# Patient Record
Sex: Female | Born: 1975
Health system: Southern US, Community
[De-identification: ages and names within clinical notes are randomized; demographics above are authoritative.]

---

## 2008-06-12 IMAGING — NM NM HEPATO W/GB/PHARM/[PERSON_NAME]
2 series · 12 of 12 positions shown · non-contrast
Comparison: None

CLINICAL DATA: Pain and vomiting

NUCLEAR MEDICINE HEPATOBILIARY IMAGING WITH GALLBLADDER EF
TECHNIQUE: Sequential images of the abdomen were obtained [DATE] minutes following intravenous administration of
radiopharmaceutical. After oral ingestion of 8oz of half and half
cream, gallbladder ejection fraction was determined.
Radiopharmaceutical:  5.2 mCi mCi [6F] Choletec

[hepato · 2.40mm/px · 6 of 60 frames shown (1 of 2)]
[frame 6/60]
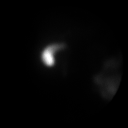
[frame 16/60]
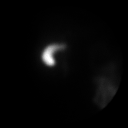
[frame 26/60]
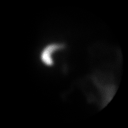
[frame 36/60]
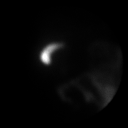
[frame 46/60]
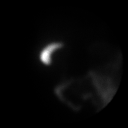
[frame 56/60]
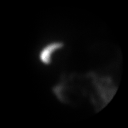

[hepato · 2.40mm/px · 6 of 60 frames shown (2 of 2)]
[frame 6/60]
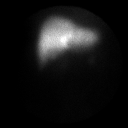
[frame 16/60]
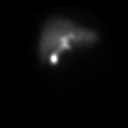
[frame 26/60]
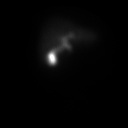
[frame 36/60]
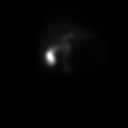
[frame 46/60]
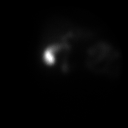
[frame 56/60]
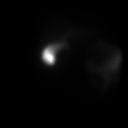

[12 of 12 positions shown; findings below may reference images not displayed]

FINDINGS: There is prompt extraction of radiotracer from the blood
pool homogeneous uptake in the liver.  The gallbladder begins to
fill by 15 minutes.  Counts are within the bowel by 40 minutes.  At
60 minutes, a fatty meal was provided orally.  The gallbladder
contracted slowly with a calculated ejection fraction equal 33% at
60 minutes (normal greater than 50%).
IMPRESSION: 1.  Mild biliary dyskinesia with ejection fraction =  33%.
2.  Patent cystic and common bile duct.

## 2008-06-23 IMAGING — RF DG CHOLANGIOGRAM OPERATIVE
1 series · 5 of 5 positions shown · non-contrast
Comparison: none available

CLINICAL DATA: Cholelithiasis

INTRAOPERATIVE CHOLANGIOGRAM
TECHNIQUE: Multiple fluoroscopic spot radiographs were obtained
during intraoperative cholangiogram and are submitted for
interpretation post-operatively.

[Series 1: run · 2 acquisitions, 5 frames shown]
[im 1/2]
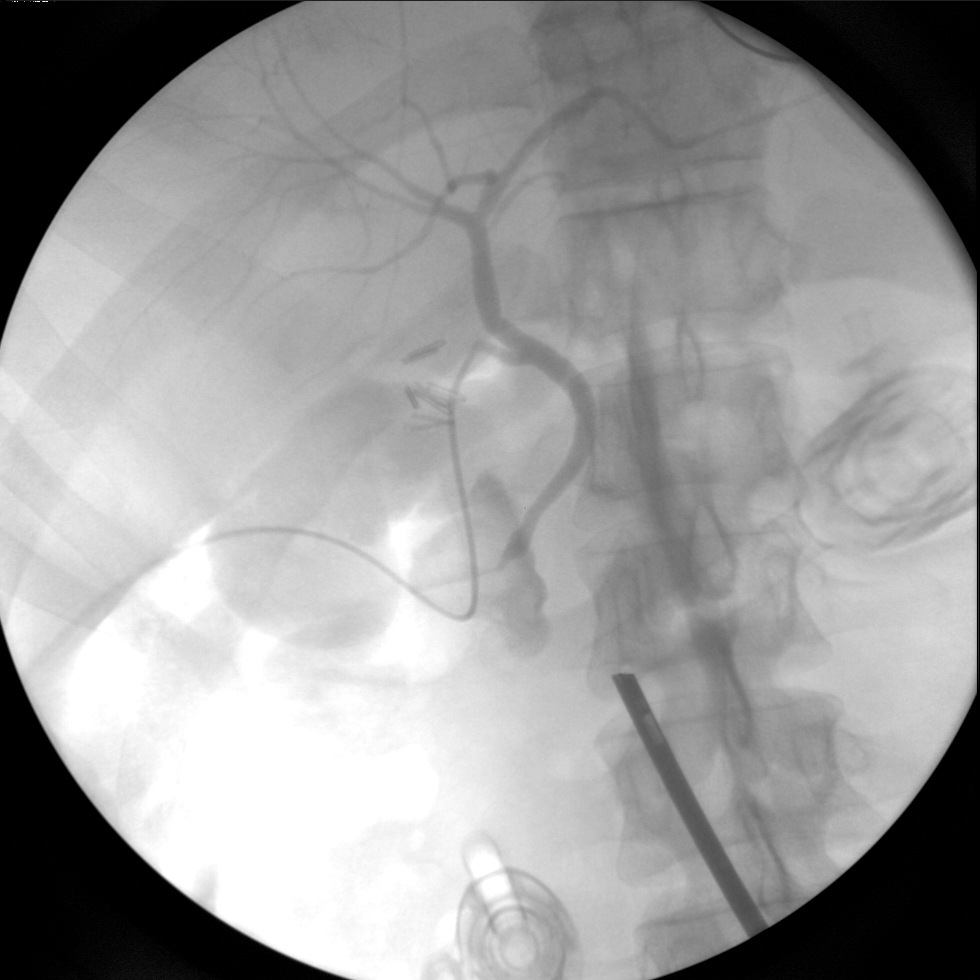
[im 1/2]
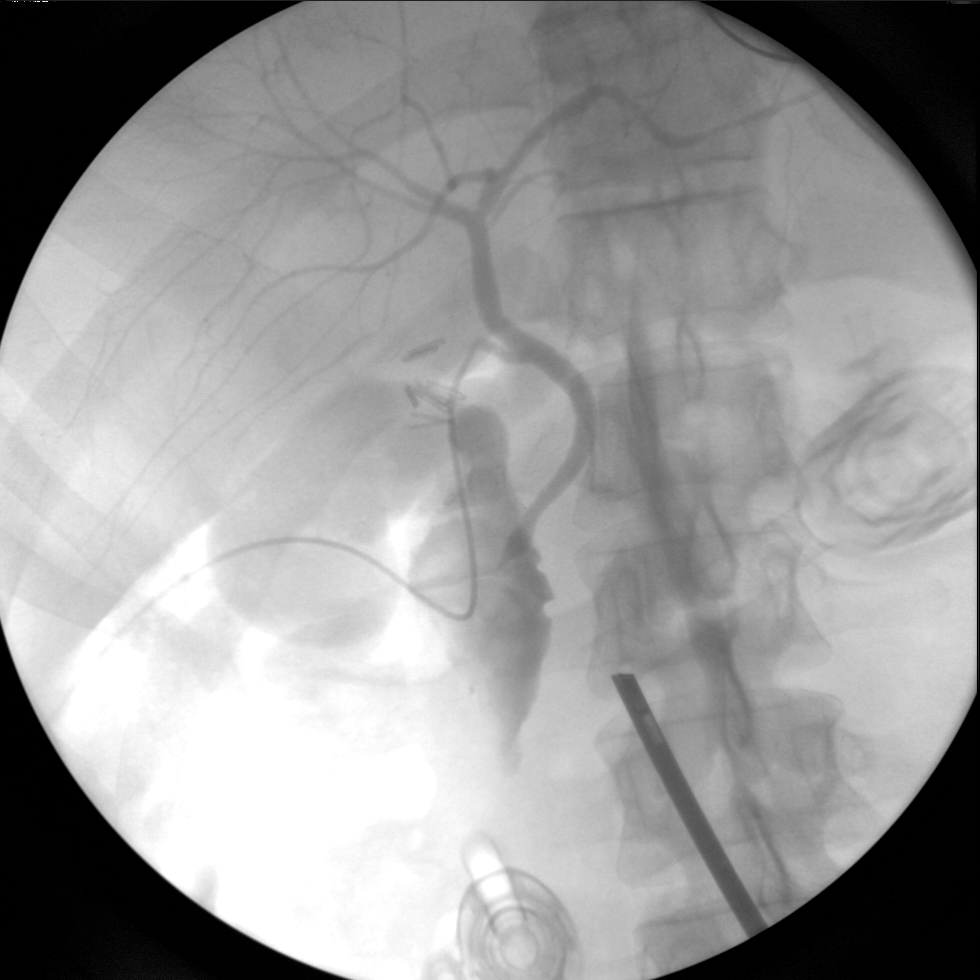
[im 1/2]
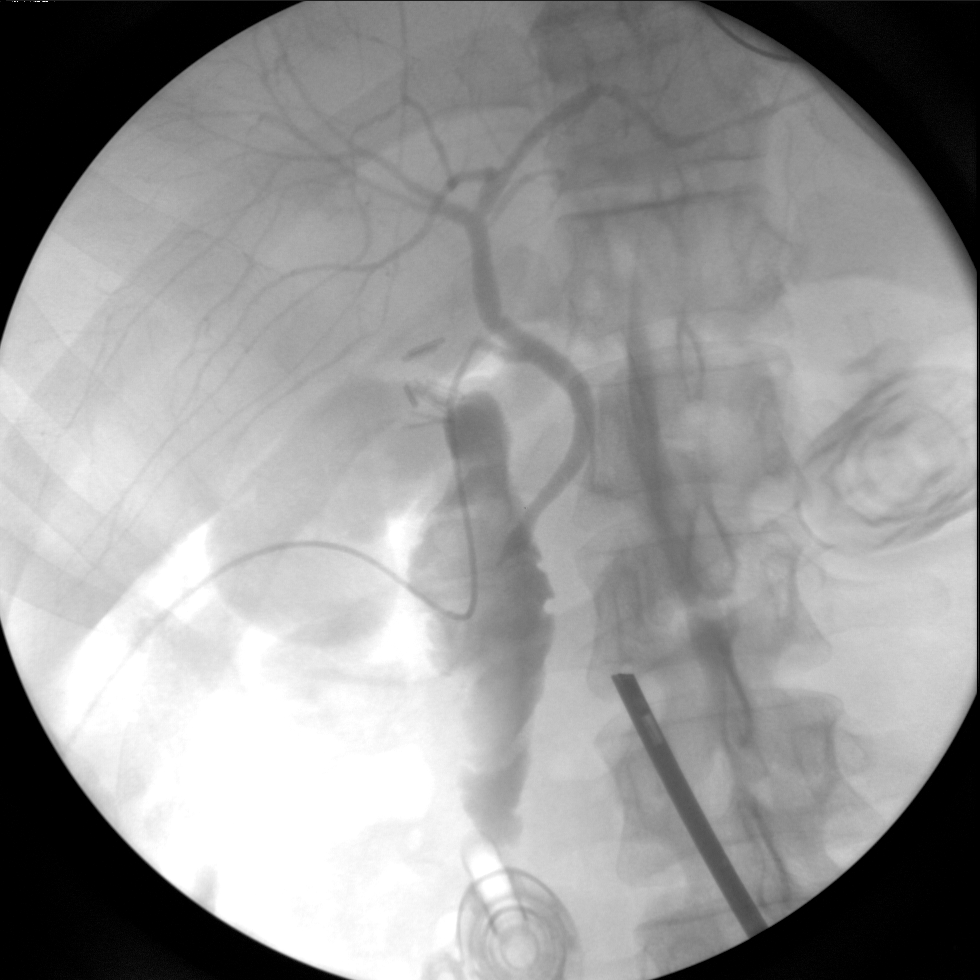
[im 1/2]
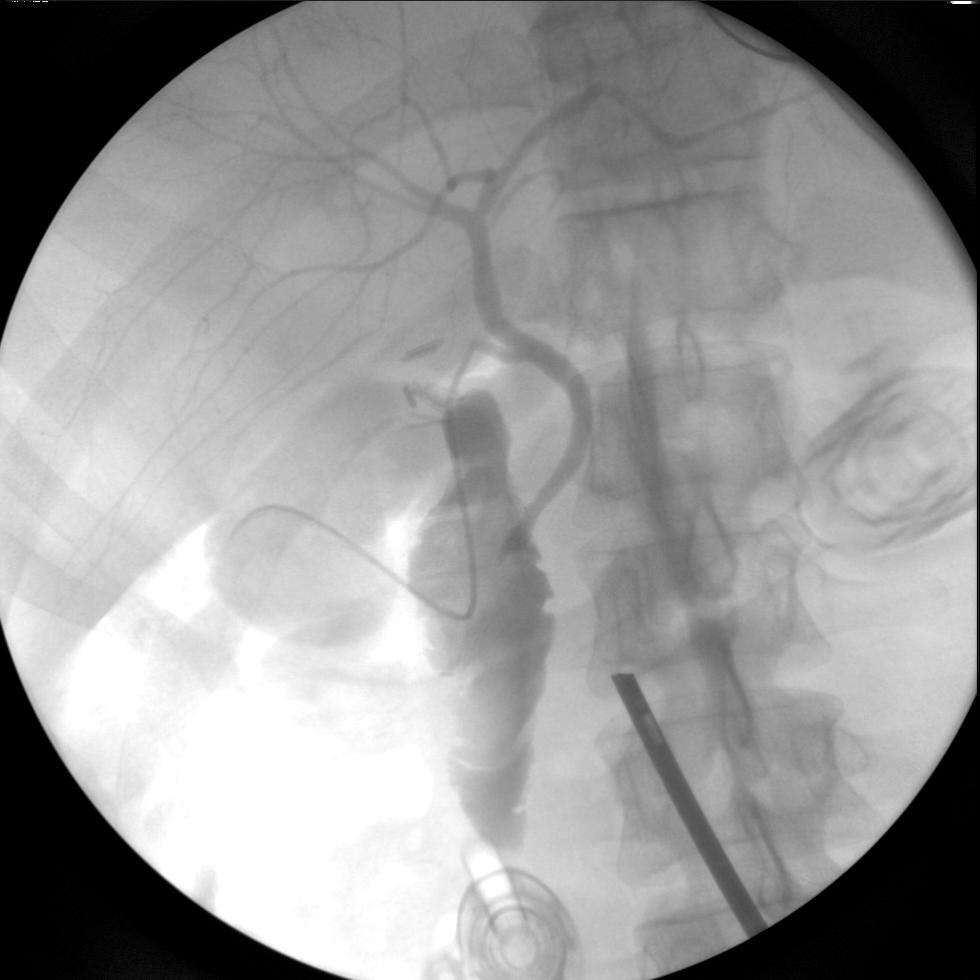
[im 2/2]
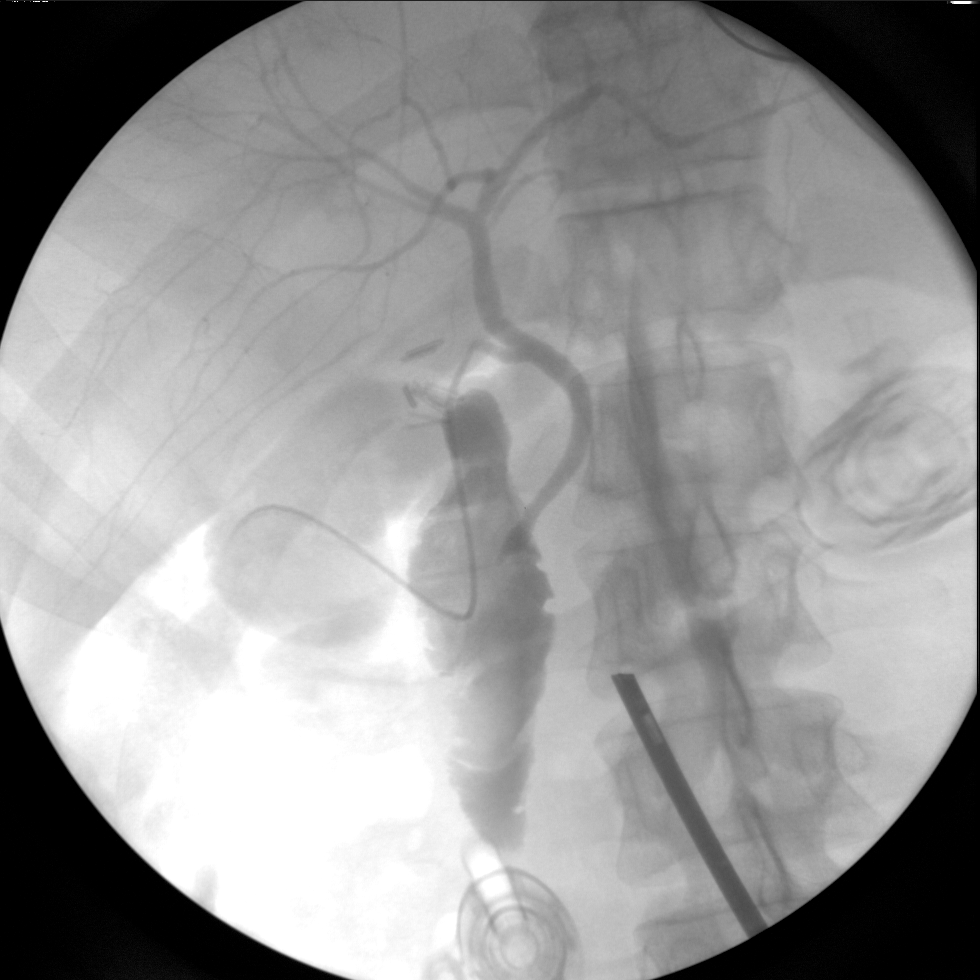

[5 of 5 positions shown; findings below may reference images not displayed]

FINDINGS: No persistent filling defects in the common duct.
Intrahepatic ducts are incompletely visualized, appearing
decompressed centrally. Contrast passes into the duodenum.

IMPRESSION

Negative for retained common duct stone.

## 2008-06-29 IMAGING — CT CT ABDOMEN W/ CM
2 of 5 series · 16 of 46 positions shown, 18 images · IV contrast (READICAT/WATER & [ID] OMNI 300)
Comparison: HIDA scan, [DATE]

CT ABDOMEN

CLINICAL DATA: Patient status post laparoscopic cholecystectomy
with right upper quadrant pain and nausea and vomiting

CT ABDOMEN AND PELVIS WITH CONTRAST
TECHNIQUE: Multidetector CT imaging of the abdomen and pelvis was
performed using the standard protocol following bolus
administration of intravenous contrast.
Contrast: 100 ml  Omni 300

[Series 3: routine abdomen · axial · 0.85mm/px · z∈[-357,+28]mm · 13 of 87 slices shown, 15 images]
[im 5/87  soft-tissue]
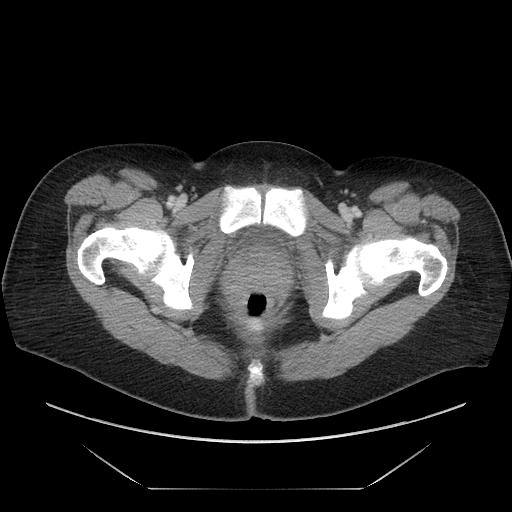
[im 5/87  bone]
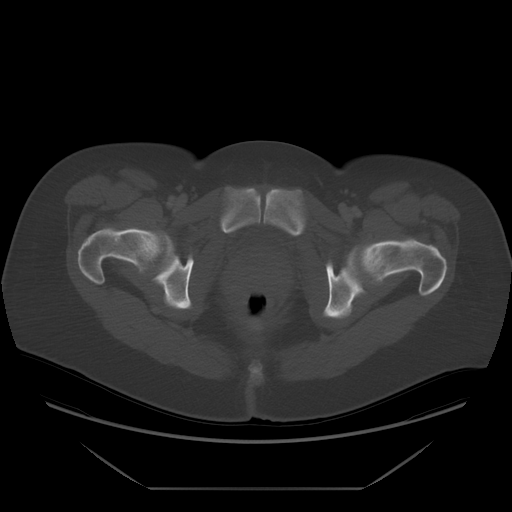
[im 14/87  soft-tissue]
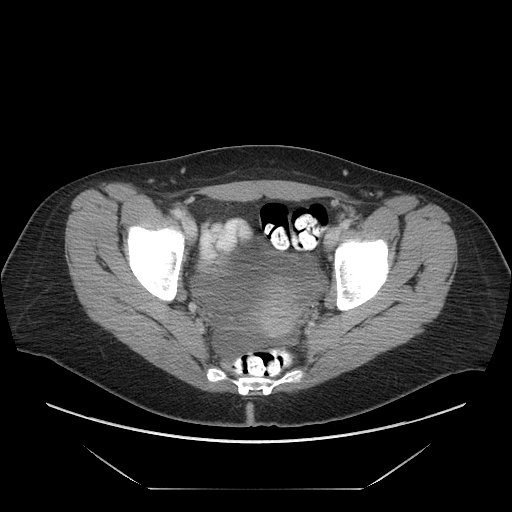
[im 19/87  soft-tissue]
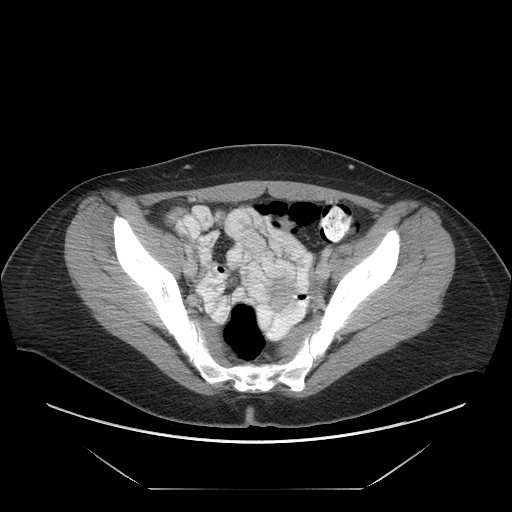
[im 23/87  soft-tissue]
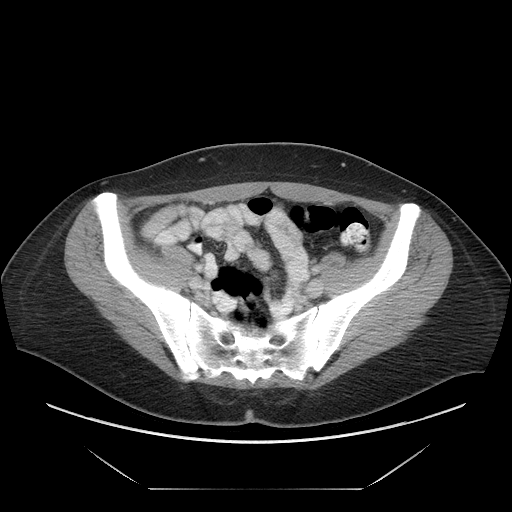
[im 32/87  soft-tissue]
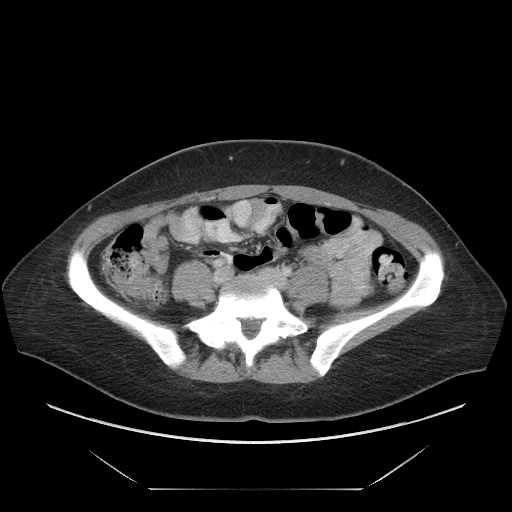
[im 37/87  soft-tissue]
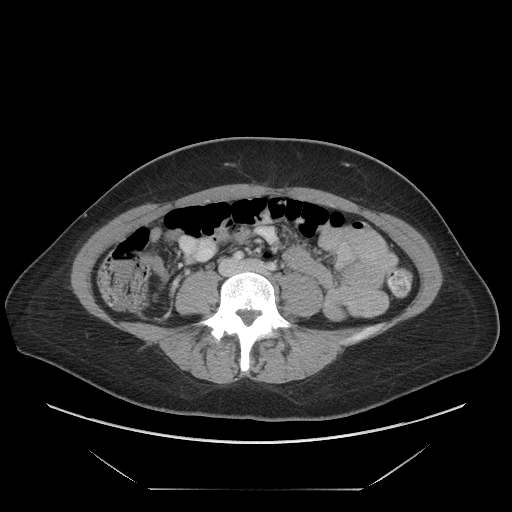
[im 46/87  soft-tissue]
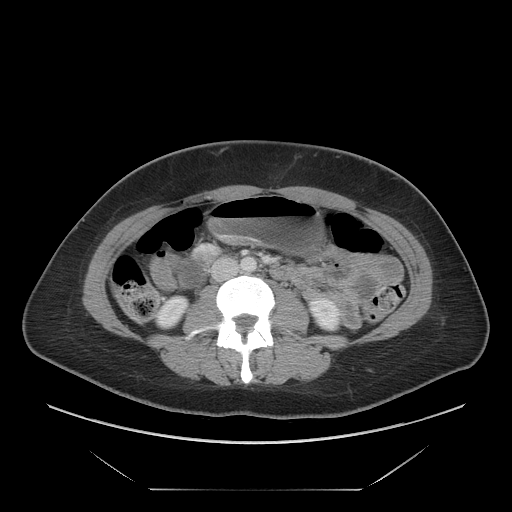
[im 50/87  soft-tissue]
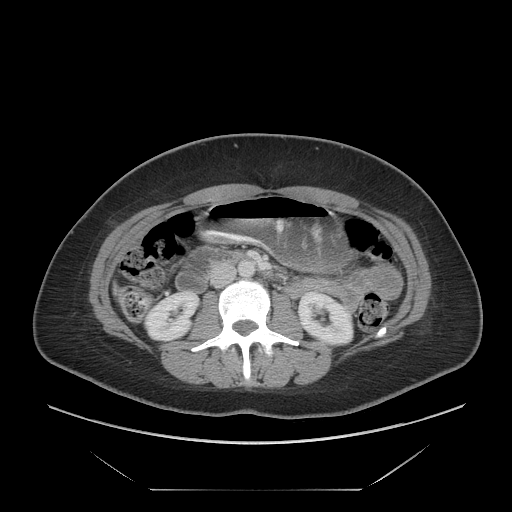
[im 55/87  soft-tissue]
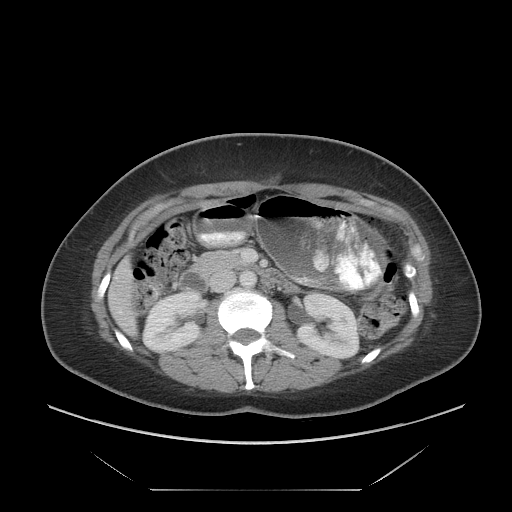
[im 55/87  bone]
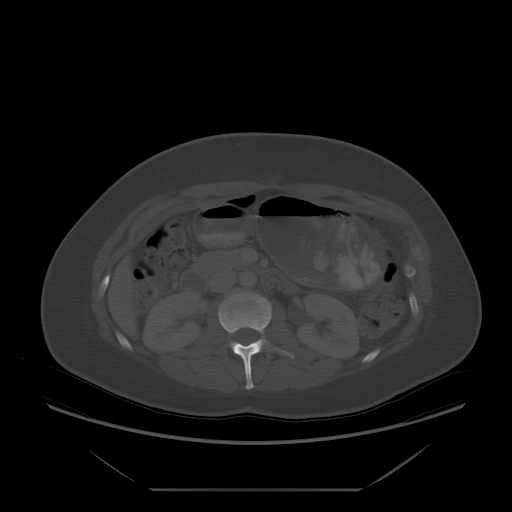
[im 64/87  soft-tissue]
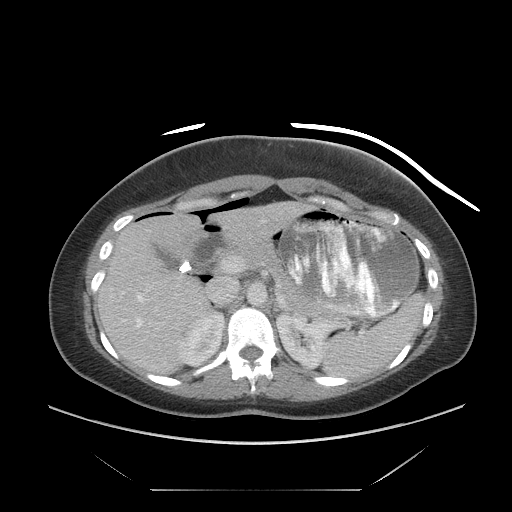
[im 68/87  soft-tissue]
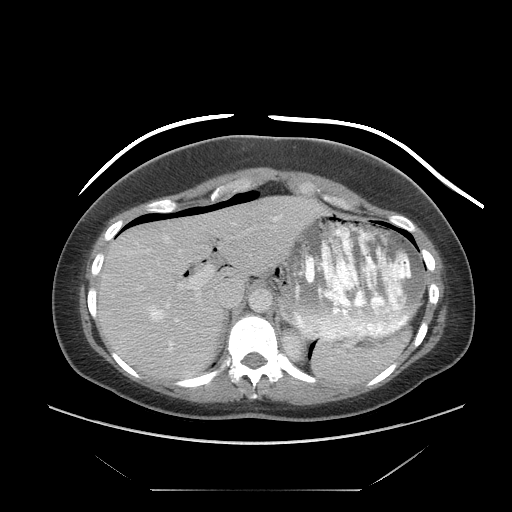
[im 73/87  soft-tissue]
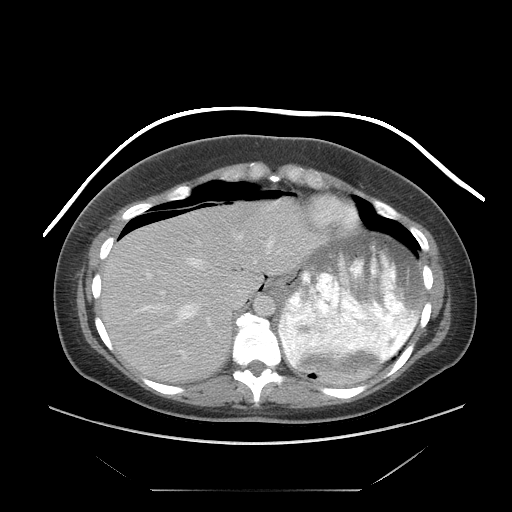
[im 82/87  soft-tissue]
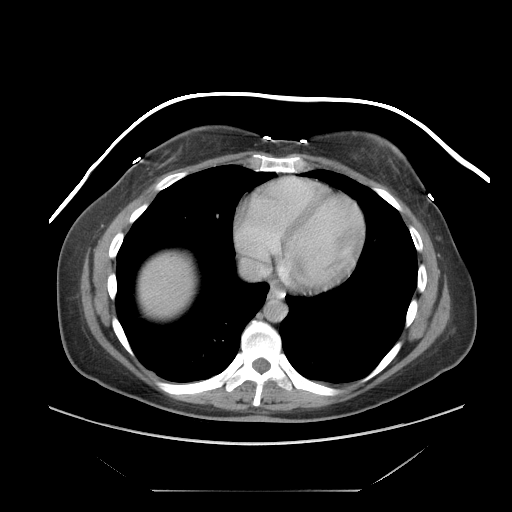

[Series 602: sagittal body · sagittal · 0.89mm/px · 3 of 174 slices shown]
[im 58/174  soft-tissue]
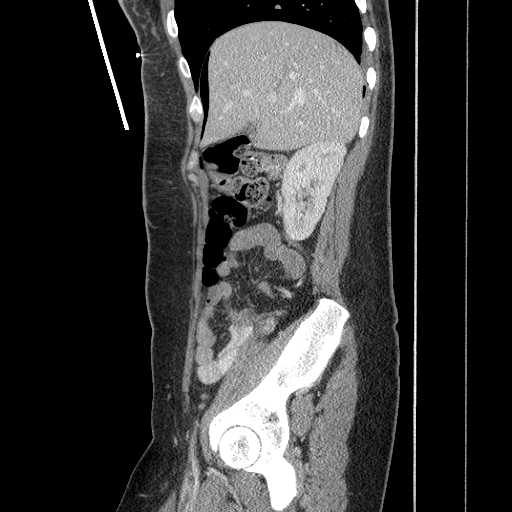
[im 77/174  soft-tissue]
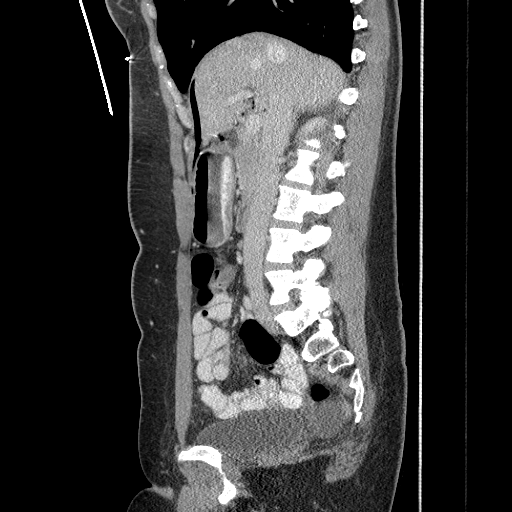
[im 97/174  soft-tissue]
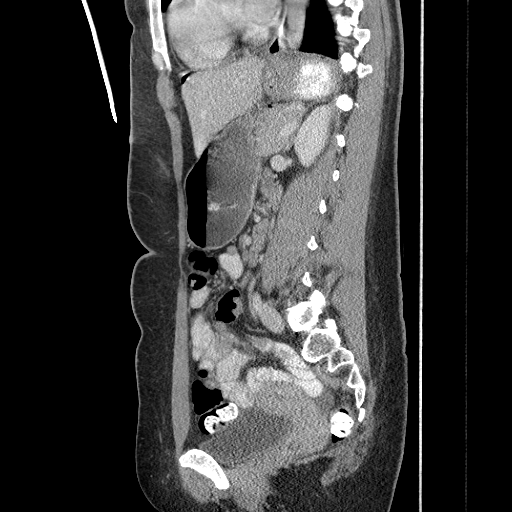

[16 of 46 positions shown; findings below may reference images not displayed]

FINDINGS: Lung bases are clear.  The heart appears normal.

Small hypodensity measuring 9 mm in the posterior right hepatic
lobe segment six (image 22).  No evidence of intrahepatic biliary
ductal dilatation.  Cholecystectomy clips in the gallbladder fossa.
No evidence of fluid collections surrounding the liver.  There is
gas in the porta hepatis which is likely postsurgical.  Gas is also
noted over the dome the liver and beneath the left hemidiaphragm.

The gallbladder, spleen, adrenal glands, and kidneys appear normal.

The stomach is moderately distended with foodstuff and oral
contrast.  The duodenum is mildly distended with fluid.  The  small
bowel, appendix, and cecum appear normal.  Colon appears normal.

Abdominal aorta is normal caliber.  No evidence of retroperitoneal
lymphadenopathy.
IMPRESSION: 1.  Moderate amount of intraperitoneal free air consistent with
recent cholecystectomy.
2.  No evidence of fluid collection to suggest biloma or abscess.
3.  Retention of food stuff and fluid in the stomach and first
portion the duodenum may represent ileus or gastroparesis.
4..  Small hypodensity in the right hepatic lobe is too small to
characterize.

CT PELVIS
FINDINGS: Bladder and uterus appear normal.  The ovaries are
normal.  There is moderate  free fluid in the pelvis.  The rectum
sigmoid colon appear normal.

No evidence of pelvic lymphadenopathy. Review of  bone windows
demonstrates no aggressive osseous lesions. Facet arthropathy on
the right at L5-S1.
IMPRESSION: 1.  Small amount of free fluid the pelvis likely related to prior
surgery.

Findings conveyed to Dr. LABELLE SALHA on [DATE]

## 2008-07-18 IMAGING — CR DG CHEST 2V
2 series · 2 of 2 positions shown · non-contrast
Comparison: [HOSPITAL] at [REDACTED] [HOSPITAL] abdominal CT
[DATE].

CLINICAL DATA: Cough, fever, and fatigue.

CHEST - 2 VIEW

[w chest pa]
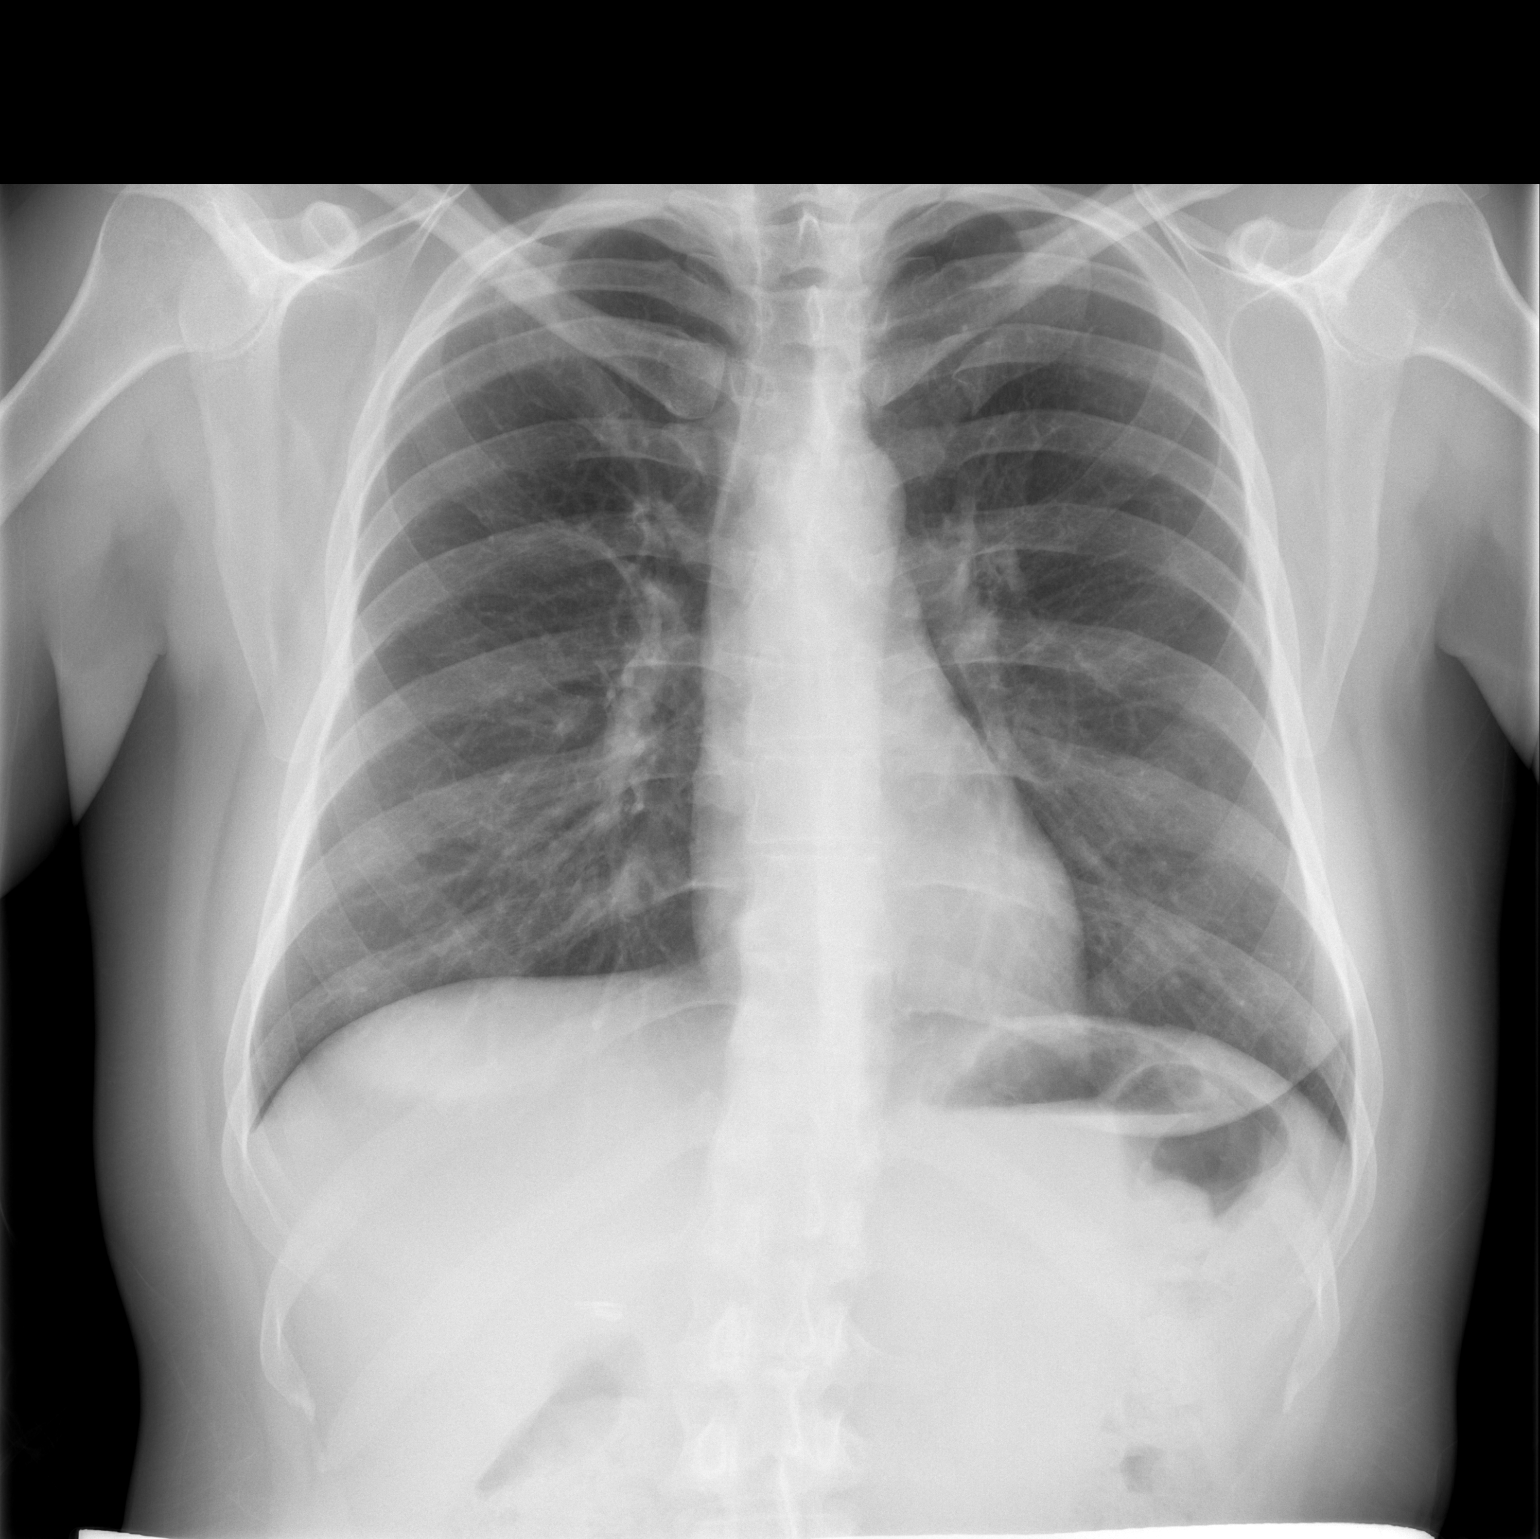

[w chest lat]
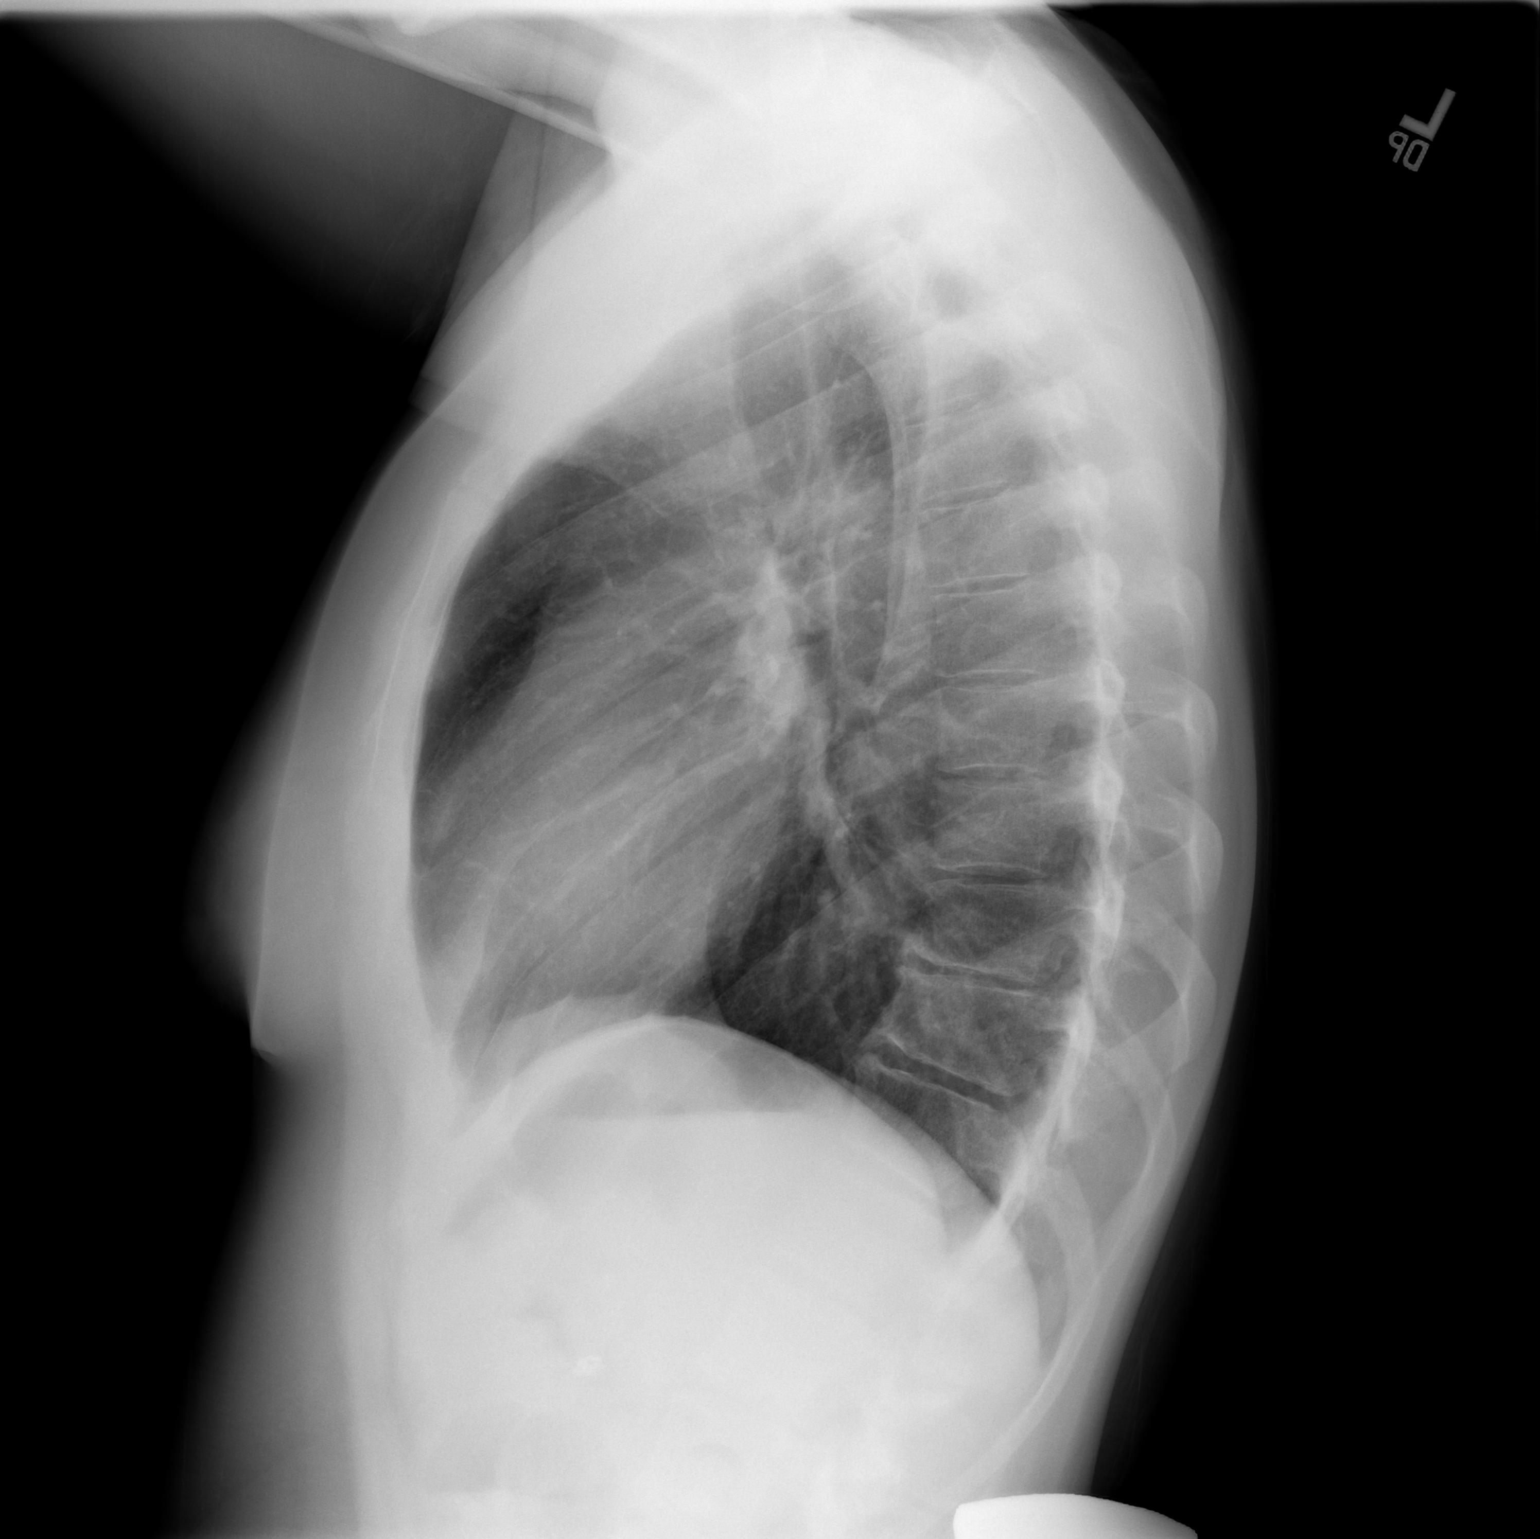

[2 of 2 positions shown; findings below may reference images not displayed]

FINDINGS: Resolution of previous pneumoperitoneum noted.  Lungs are
clear.  Heart size is normal.  Mediastinum, hila, pleura appear
normal.  Stable slight levo rotary scoliosis thoracolumbar spine
junction noted.
IMPRESSION: 1.  Resolution previous pneumoperitoneum.
2.  No active cardiopulmonary disease.

## 2018-09-27 MED FILL — AMOXICILLIN 500 MG CAPSULE: 500 | 10 days supply | Qty: 30 | Fill #0

## 2018-10-04 MED FILL — AMOX-CLAV 875-125 MG TABLET: 875-125 | 10 days supply | Qty: 20 | Fill #0

## 2018-10-14 IMAGING — DX DG CHEST 2V
2 series · 2 of 2 positions shown · non-contrast
Comparison: [DATE]

CLINICAL DATA: Nonproductive cough for 2 months.

EXAM:
CHEST - 2 VIEW

[dg chest 2 view (1 of 2)]
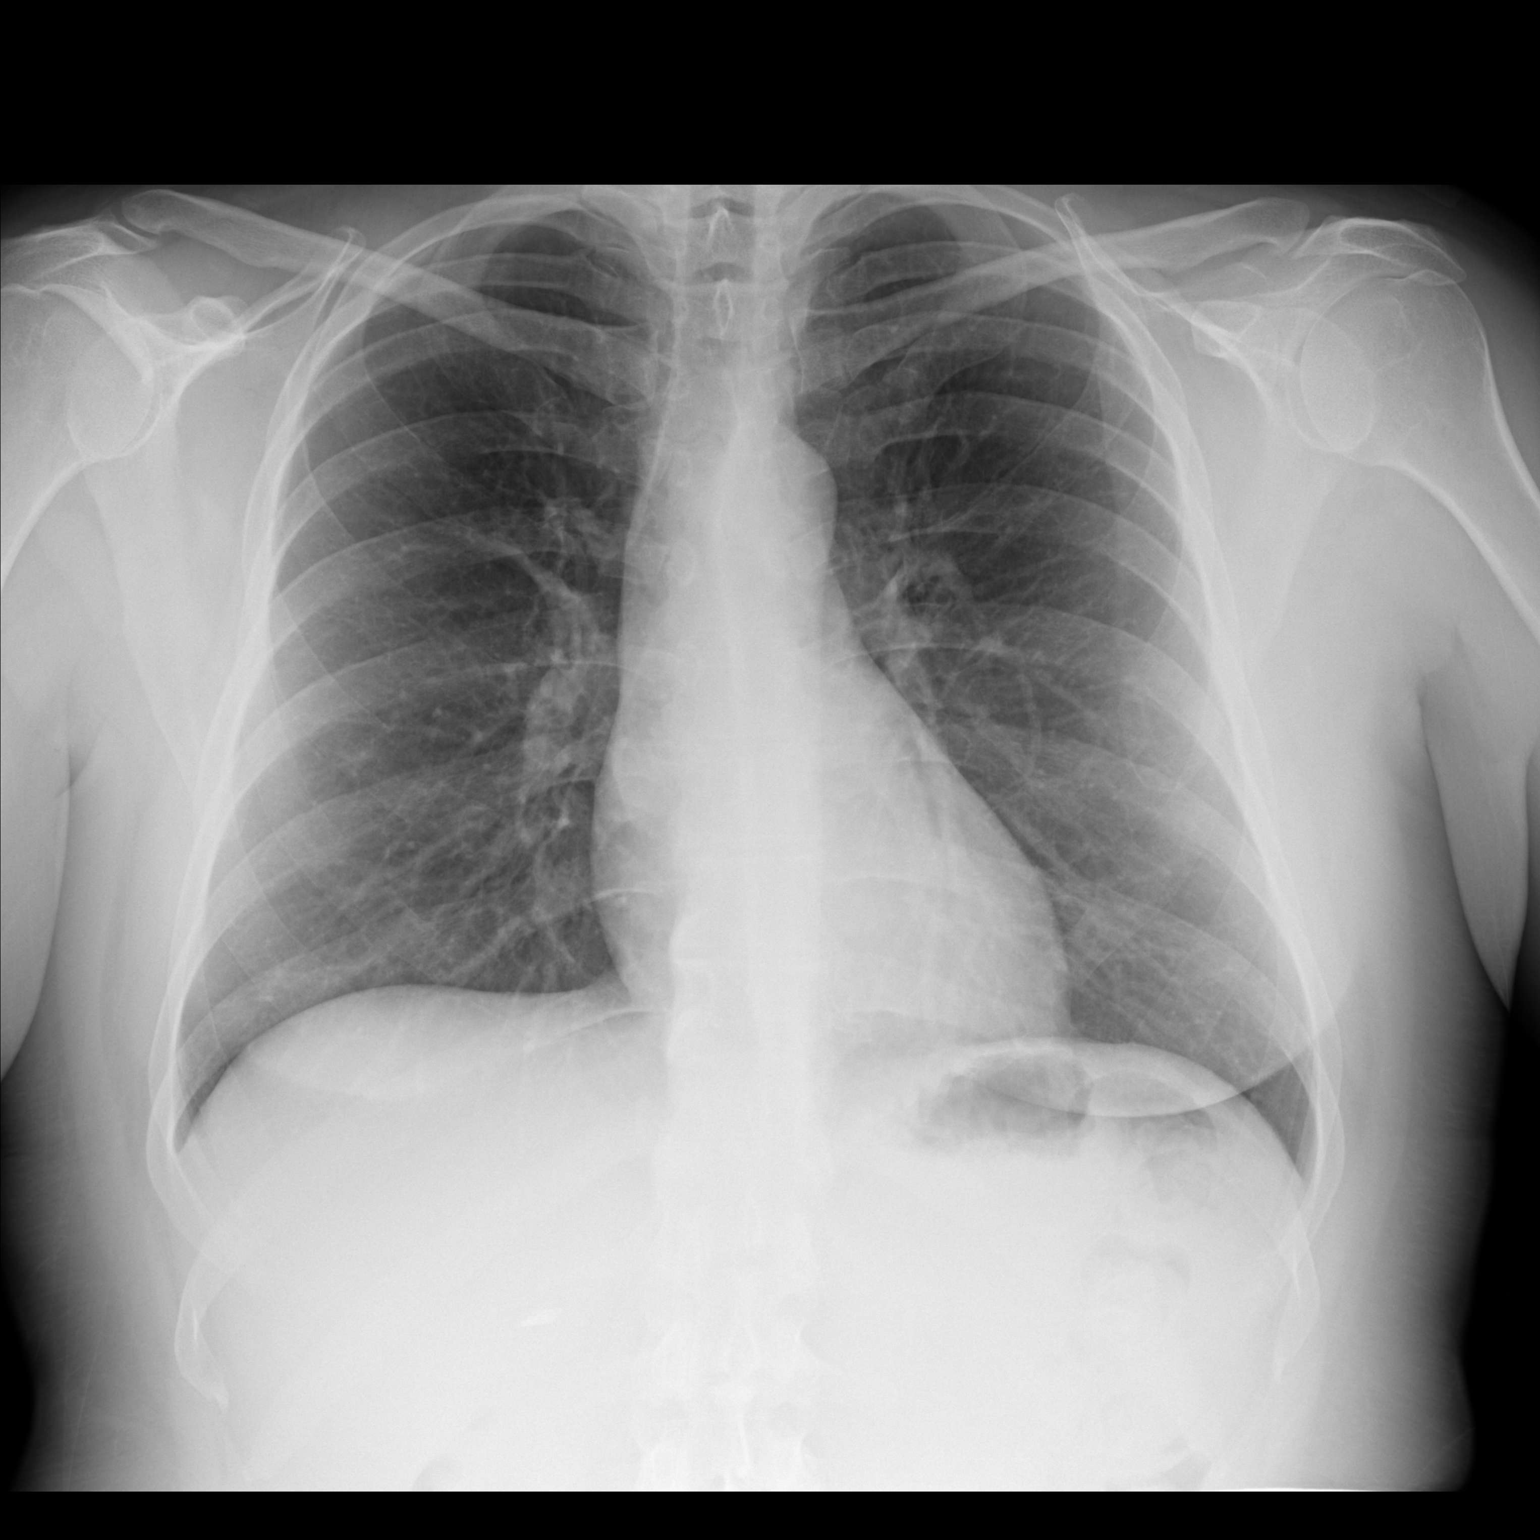

[dg chest 2 view (2 of 2)]
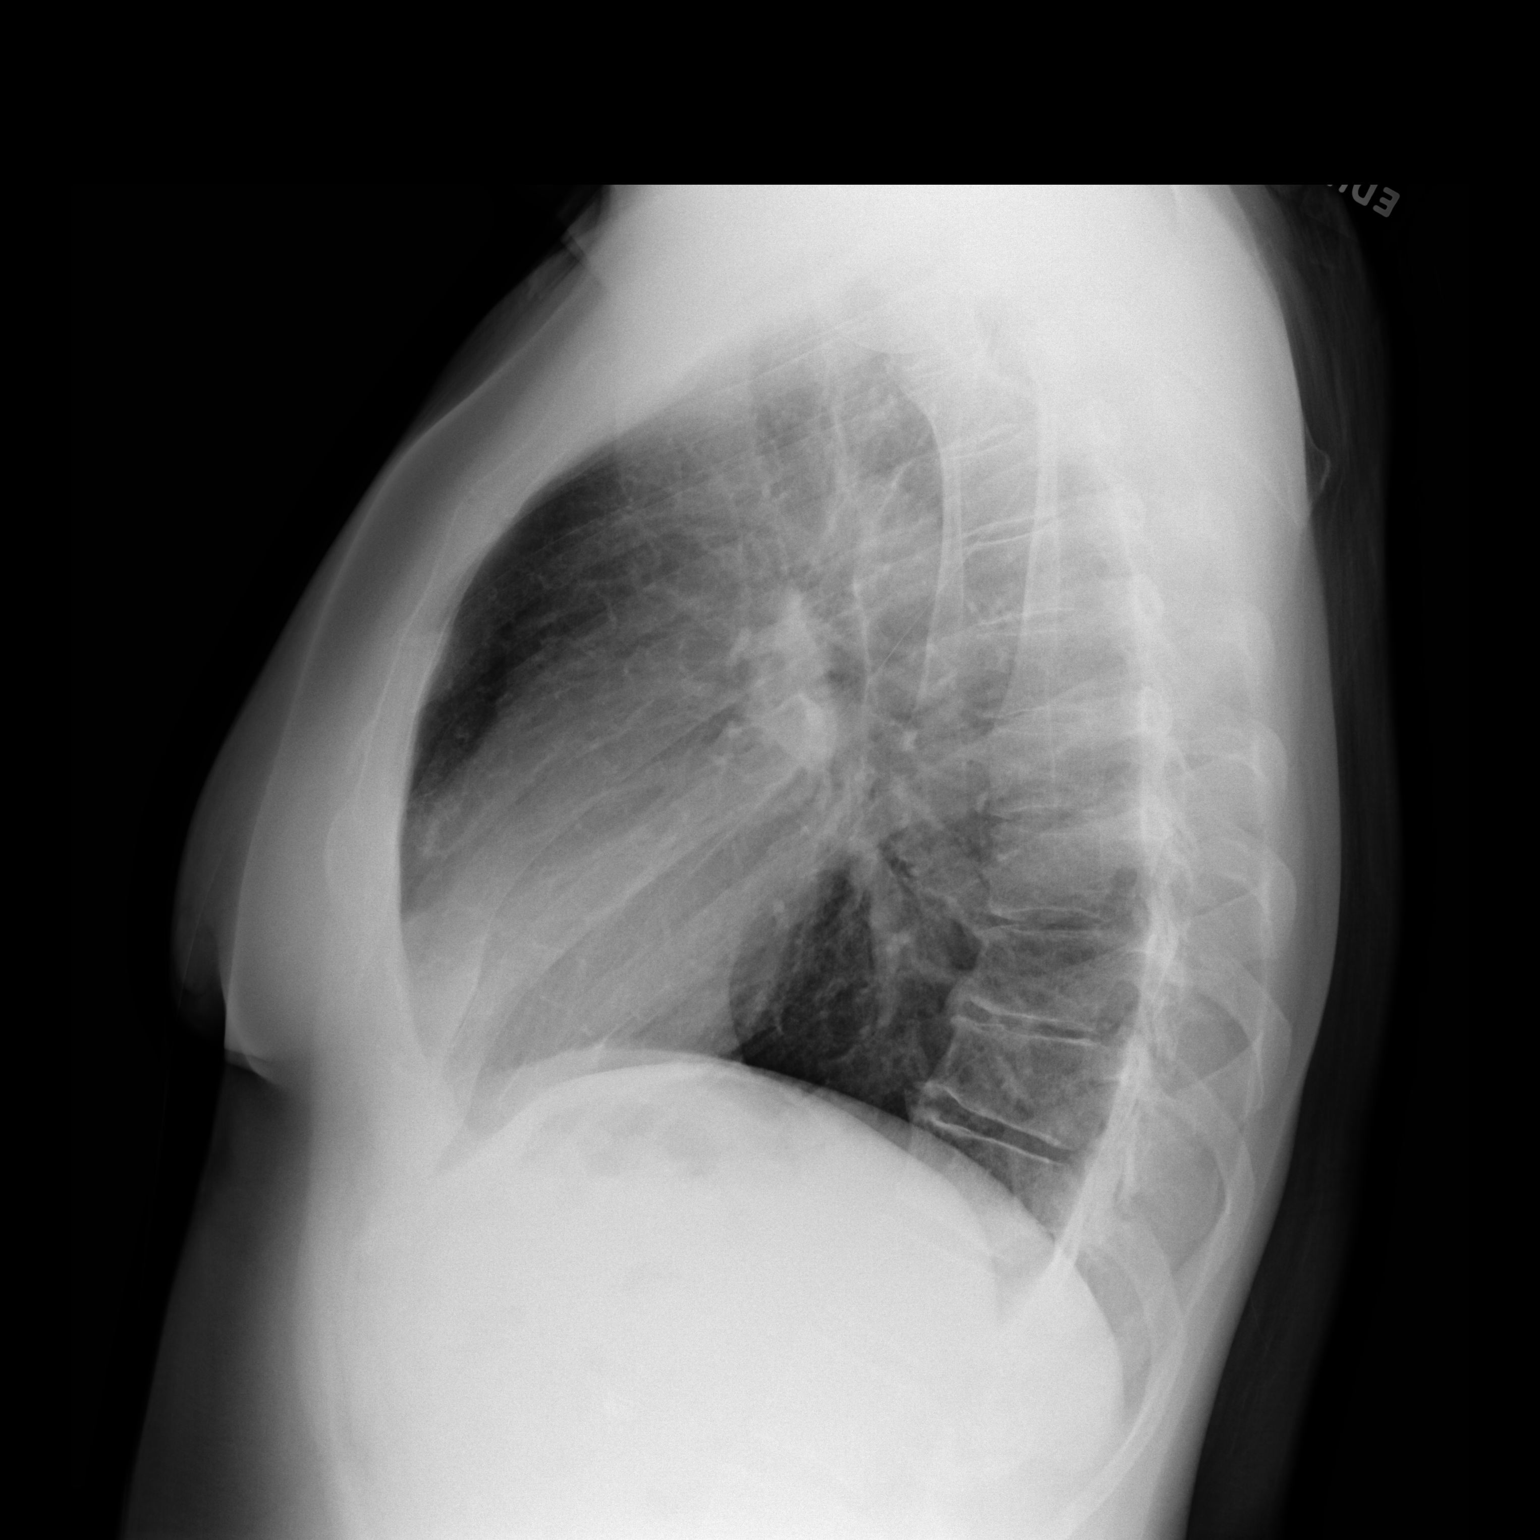

[2 of 2 positions shown; findings below may reference images not displayed]

FINDINGS: The heart size and mediastinal contours are within normal limits.
Both lungs are clear. The visualized skeletal structures are
unremarkable.
IMPRESSION: Negative.  No active cardiopulmonary disease.

## 2018-10-14 MED FILL — predniSONE 10 MG TABS: 10 | 12 days supply | Qty: 48 | Fill #0

## 2018-10-14 MED FILL — levoFLOXacin 500 MG TABS: 500 | 7 days supply | Qty: 7 | Fill #0

## 2018-10-25 MED FILL — DOXYCYCLINE HYC 100 MG CAPS: 100 | 10 days supply | Qty: 20 | Fill #0

## 2018-10-25 MED FILL — ESOMEPRAZOLE MAGNESIUM 20 M: 20 | 20 days supply | Qty: 20 | Fill #0

## 2019-02-08 MED FILL — ESOMEPRAZOLE MAGNESIUM 20 M: 20 | 20 days supply | Qty: 20 | Fill #1

## 2019-02-17 MED FILL — BLISOVI 24 FE 1-20 MG-MCG(2: 1-20 | 28 days supply | Qty: 28 | Fill #0

## 2019-03-17 MED FILL — BLISOVI 24 FE 1-20 MG-MCG(2: 1-20 | 84 days supply | Qty: 84 | Fill #1

## 2019-05-31 MED FILL — BLISOVI 24 FE 1-20 MG-MCG(2: 1-20 | 90 days supply | Qty: 112 | Fill #0

## 2019-08-25 MED FILL — BLISOVI 24 FE 1-20 MG-MCG(2: 1-20 | 90 days supply | Qty: 112 | Fill #1

## 2019-11-17 MED FILL — ALPRAZolam 0.25 MG TABS: 0.25 | 10 days supply | Qty: 20 | Fill #0

## 2019-11-29 MED FILL — BLISOVI 24 FE 1-20 MG-MCG(2: 1-20 | 90 days supply | Qty: 112 | Fill #2

## 2020-01-10 MED FILL — DOXYCYCLINE HYCLATE 100 MG: 100 | 3 days supply | Qty: 6 | Fill #0

## 2020-05-30 MED FILL — CITALOPRAM HBR 20 MG TABLET: 20 | 30 days supply | Qty: 30 | Fill #0

## 2020-06-14 MED FILL — ALPRAZolam 0.5 MG TABS: 0.5 | 2 days supply | Qty: 2 | Fill #0

## 2020-06-19 MED FILL — ESCITALOPRAM 20 MG TABLET: 20 | 30 days supply | Qty: 30 | Fill #0

## 2020-06-19 MED FILL — traZODone HCL 100 MG TABS: 100 | 30 days supply | Qty: 30 | Fill #0

## 2020-06-28 IMAGING — MR MR BREAST BILAT WO/W CM
8 of 12 series · 31 of 48 positions shown · IV contrast (6 ml gadavist)
Comparison: Screening mammogram dated [DATE]. No previous
breast MRI.

CLINICAL DATA: Family history of breast cancer (including mother at
age 43).

LABS:  Not applicable
EXAM:
BILATERAL BREAST MRI WITH AND WITHOUT CONTRAST
TECHNIQUE: Multiplanar, multisequence MR images of both breasts were obtained
prior to and following the intravenous administration of 6 ml of
Gadavist

[Series 2: t2_tirm_tra ipat (a-p) · axial · 3.0mm · 0.66mm/px · 1 of 60 slices shown]
[im 1/60]
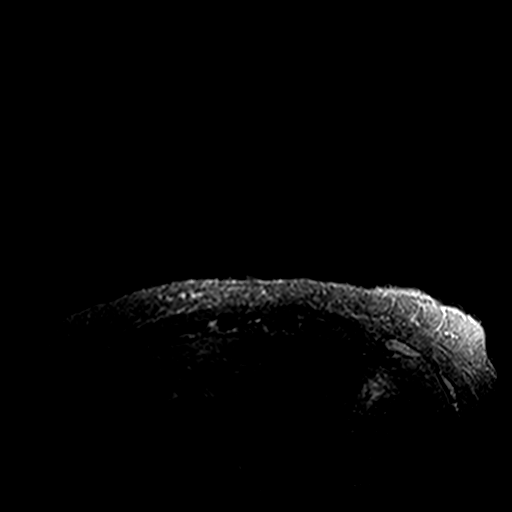

[Series 3: fl3d pre-cm no · axial · non-contrast · 1.2mm · 0.89mm/px · z∈[-48,+143]mm · 5 of 160 slices shown]
[im 1/160]
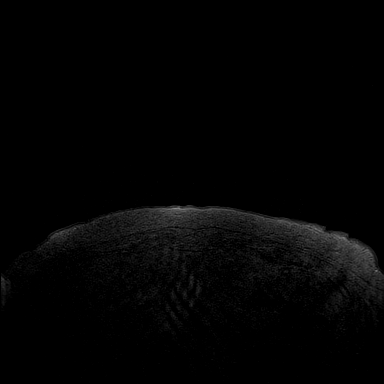
[im 40/160]
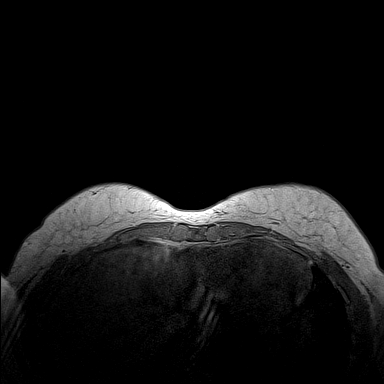
[im 80/160]
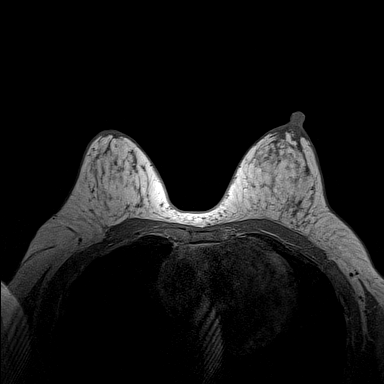
[im 120/160]
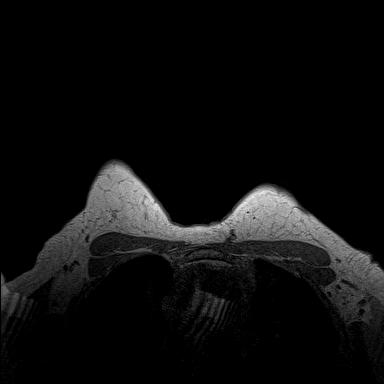
[im 160/160]
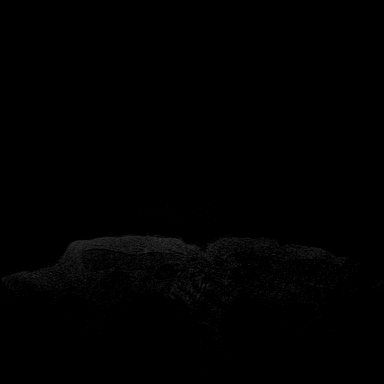

[Series 4: fl3d pre-cm · axial · non-contrast · 1.2mm · 0.89mm/px · z∈[-39,+133]mm · 5 of 144 slices shown]
[im 1/144]
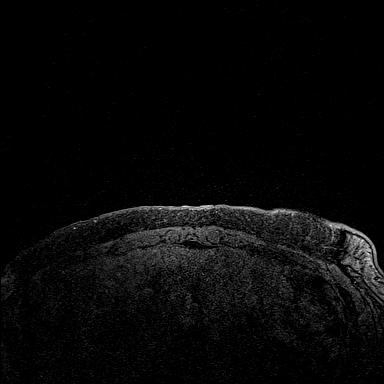
[im 36/144]
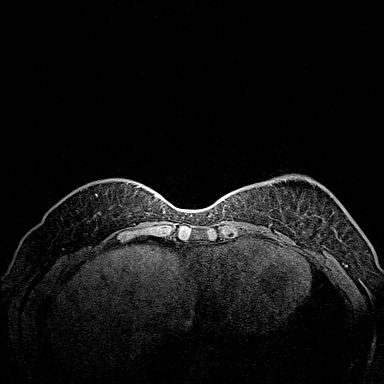
[im 72/144]
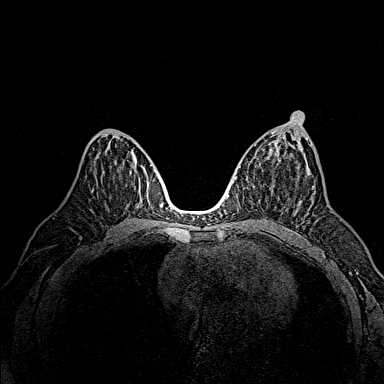
[im 108/144]
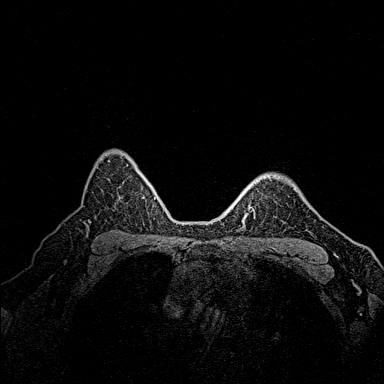
[im 144/144]
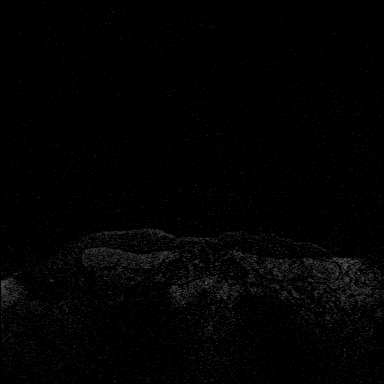

[Series 5: fl3d post-cm 20 · axial · 1.2mm · 0.89mm/px · z∈[-39,+133]mm · 5 of 144 slices shown (1 of 3)]
[im 1/144]
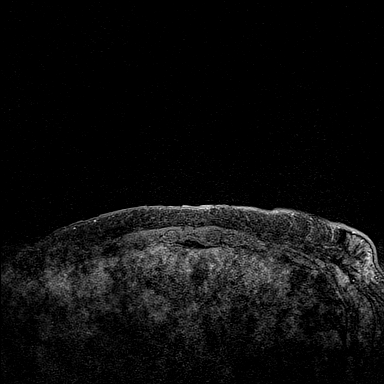
[im 36/144]
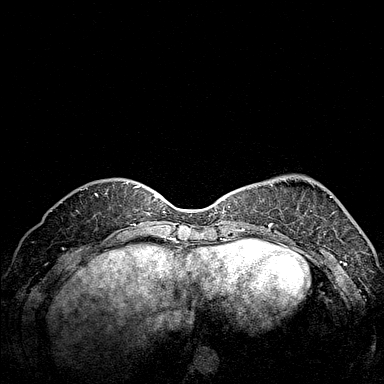
[im 72/144]
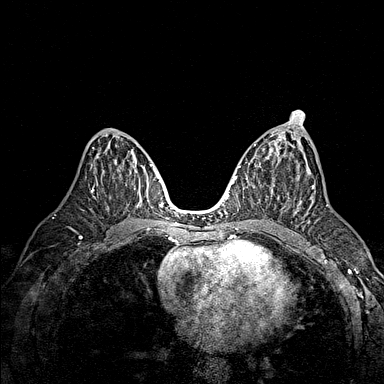
[im 108/144]
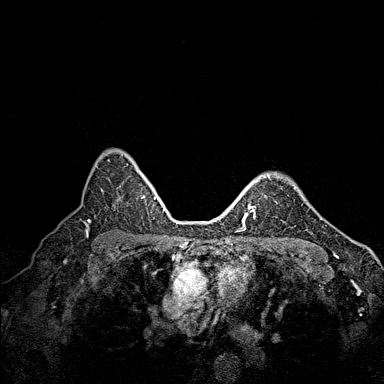
[im 144/144]
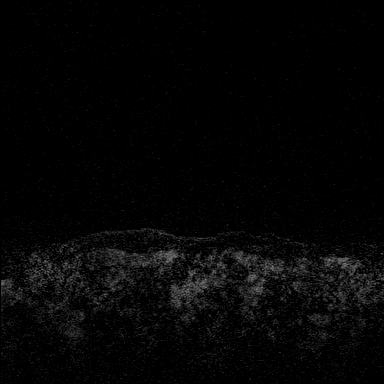

[Series 6: fl3d post-cm 20 · axial · 1.2mm · 0.89mm/px · z∈[-39,+133]mm · 5 of 144 slices shown (2 of 3)]
[im 1/144]
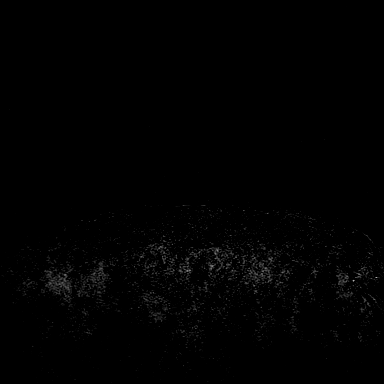
[im 36/144]
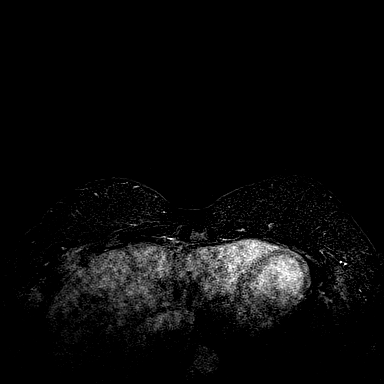
[im 72/144]
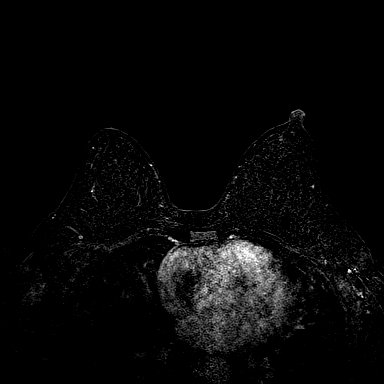
[im 108/144]
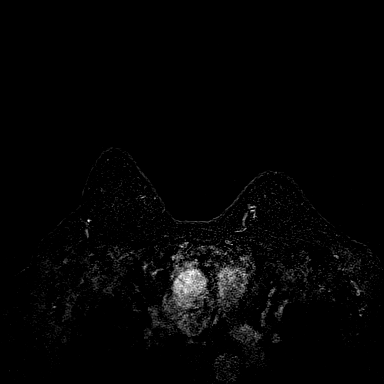
[im 144/144]
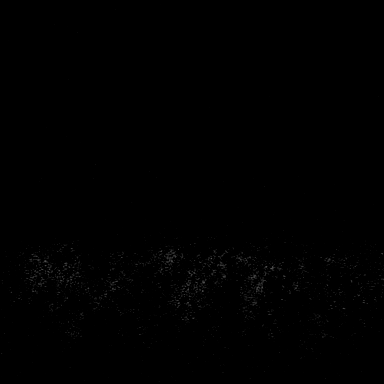

[Series 7: fl3d post-cm 20 · axial · 172.8mm · 0.89mm/px · 1 of 1 slices shown (3 of 3)]
[im 1/1]
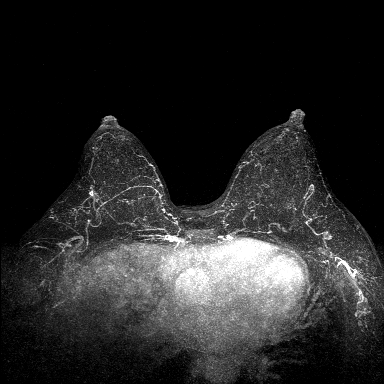

[Series 8: fl3d post-cm 3min · axial · 1.2mm · 0.89mm/px · z∈[-39,+133]mm · 6 of 144 slices shown]
[im 1/144]
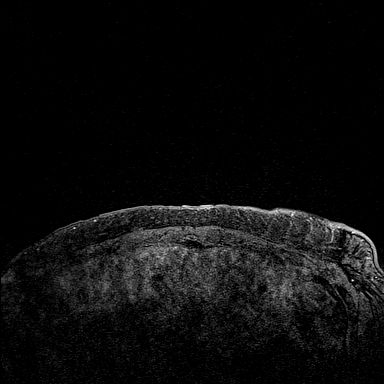
[im 29/144]
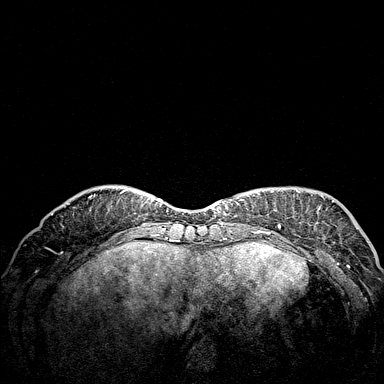
[im 58/144]
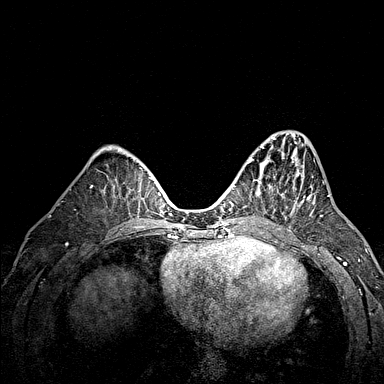
[im 86/144]
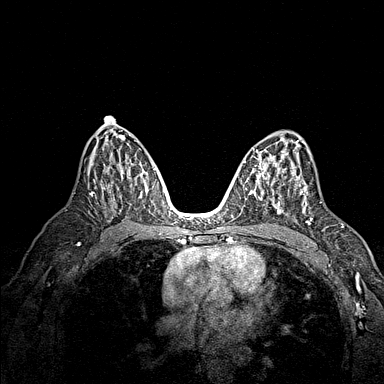
[im 115/144]
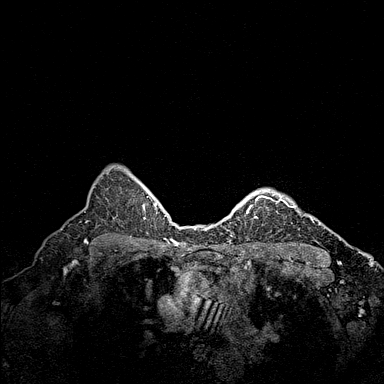
[im 144/144]
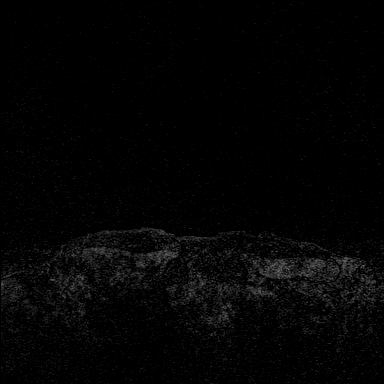

[Series 9: fl3d post-cm 3min_sub · axial · 1.2mm · 0.89mm/px · z∈[-39,+30]mm · 3 of 144 slices shown]
[im 1/144]
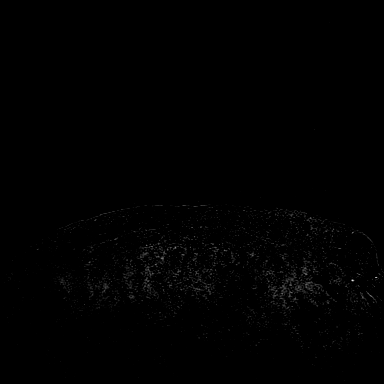
[im 29/144]
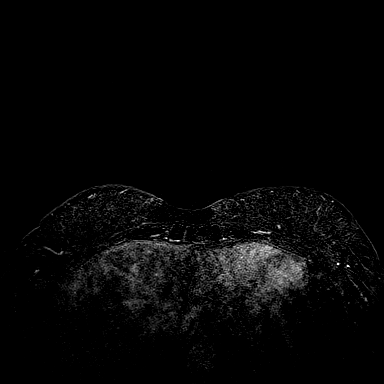
[im 58/144]
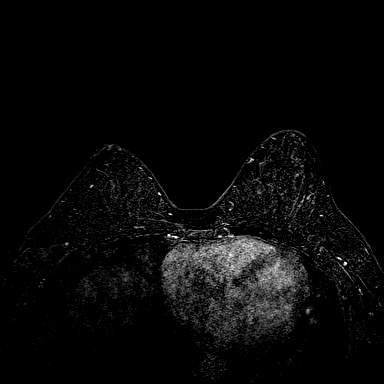

[31 of 48 positions shown; findings below may reference images not displayed]

Three-dimensional MR images were rendered by post-processing of the
original MR data on an independent workstation. The
three-dimensional MR images were interpreted, and findings are
reported in the following complete MRI report for this study. Three
dimensional images were evaluated at the independent interpreting
workstation using the DynaCAD thin client.
FINDINGS: Breast composition: b. Scattered fibroglandular tissue.

Background parenchymal enhancement: Mild

Right breast: Oval circumscribed enhancing mass within the inner
subareolar RIGHT breast, 2 o'clock axis region, at anterior depth,
measuring 9 mm greatest dimension, with sub threshold enhancement
kinetics (series 9, image 57)

No additional enhancing mass, suspicious non-mass enhancement or
secondary signs of malignancy within the RIGHT breast.

Left breast: Focal non-mass enhancement within the lower LEFT
breast, 6 o'clock axis region, at posterior depth, measuring 8 mm
greatest dimension, with persistent enhancement kinetics (series 9,
image 96).

Additional focal non-mass enhancement within the upper outer
quadrant of the LEFT breast, middle to posterior depth, measuring
1.2 cm, with persistent enhancement kinetics (series 9, image 52)

No additional enhancing mass, suspicious non-mass enhancement or
secondary signs of malignancy within the LEFT breast.

Lymph nodes: No abnormal appearing lymph nodes.

Ancillary findings:  None.
IMPRESSION: 1. Circumscribed enhancing mass within the inner subareolar RIGHT
breast, 2 o'clock axis region, measuring 9 mm, with sub threshold
enhancement kinetics. This may represent focal benign duct ectasia
or benign cyst. Recommend targeted second-look ultrasound for
further characterization.
2. Two separate foci of focal non-mass enhancement within the LEFT
breast, 6 o'clock axis and upper outer quadrant, measuring 8 mm and
1.2 cm respectively. These are suspicious findings for which MRI
guided biopsies are recommended.

RECOMMENDATION:
1. Targeted second-look ultrasound for the circumscribed mass within
the inner subareolar RIGHT breast, 2 o'clock axis region, measuring
9 mm. If a suspicious finding is seen by ultrasound,
ultrasound-guided biopsy would be recommended. If no sonographic
correlate, a follow-up breast MRI in 6 months would be recommended
if MRI-guided biopsies for the LEFT breast are both benign and
concordant.
2. MRI-guided biopsies for the 2 separate foci of focal non-mass
enhancement within the LEFT breast, 6 o'clock axis (series 9, image
96) and upper outer quadrant (series 9, image 52), measuring 8 mm
and 1.2 cm respectively.

BI-RADS CATEGORY  4: Suspicious.

## 2020-07-11 IMAGING — US US  BREAST BX W/ LOC DEV 1ST LESION IMG BX SPEC US GUIDE*R*
1 series · 7 of 7 positions shown · non-contrast
Comparison: Previous exam(s).
COMPARISON: Previous exam(s).

Addendum:
CLINICAL DATA: Patient with indeterminate right breast mass 2
o'clock position.

EXAM:
ULTRASOUND GUIDED RIGHT BREAST CORE NEEDLE BIOPSY

[Series 1: us breast bx w/ loc dev 1st lesion img bx spec us  · 0.05mm/px · 7 of 7 slices shown]
[im 1/7]
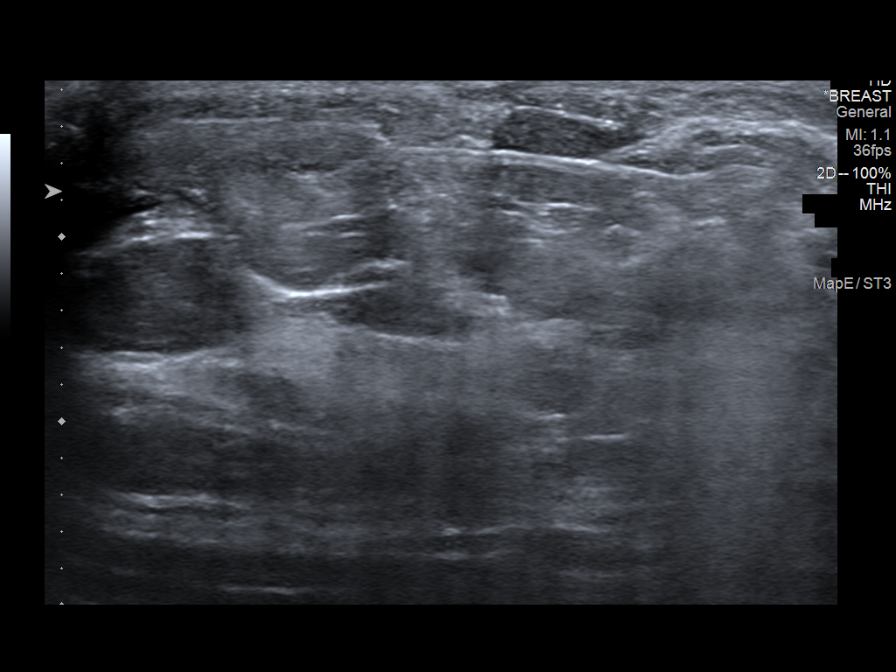
[im 2/7]
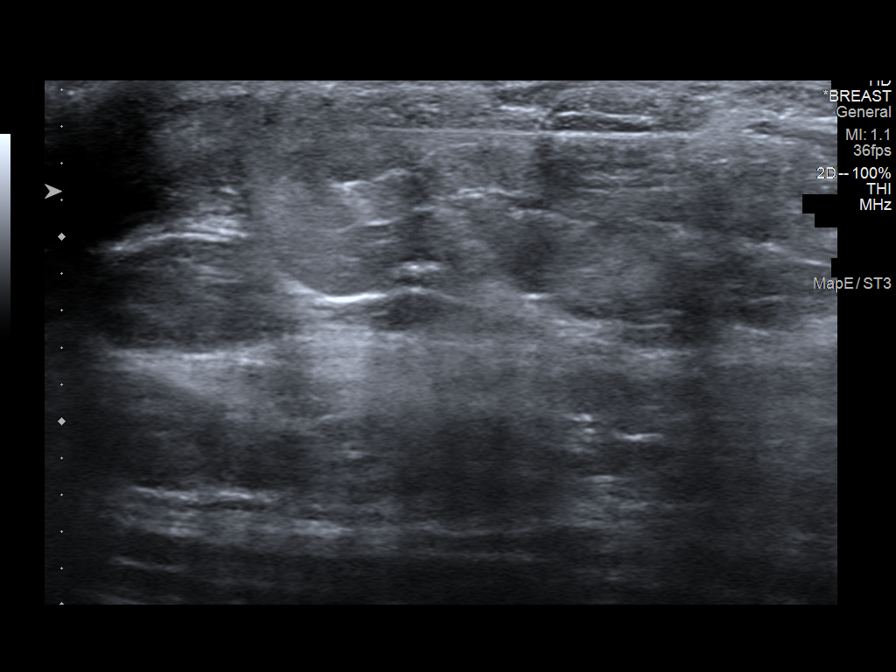
[im 3/7]
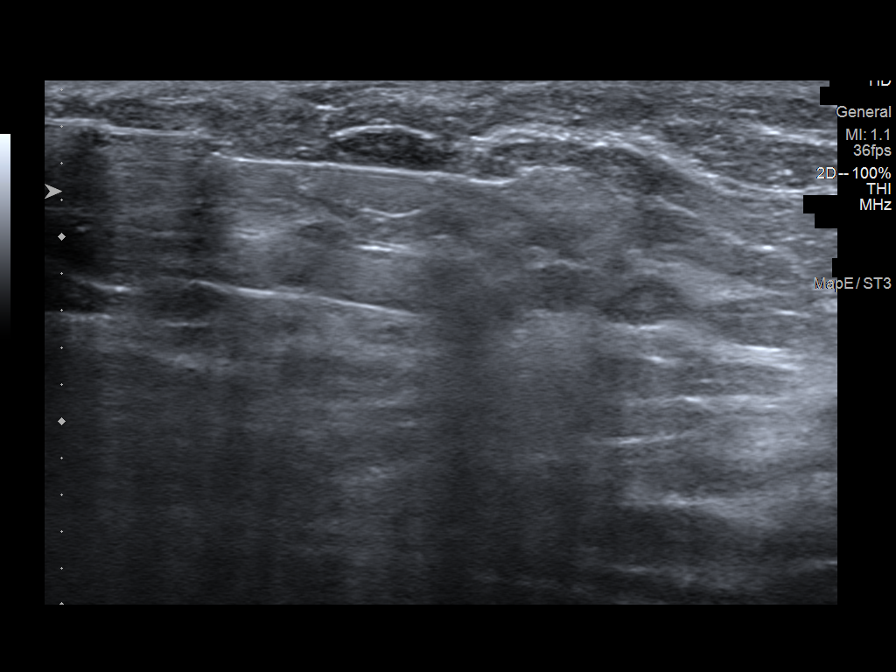
[im 4/7]
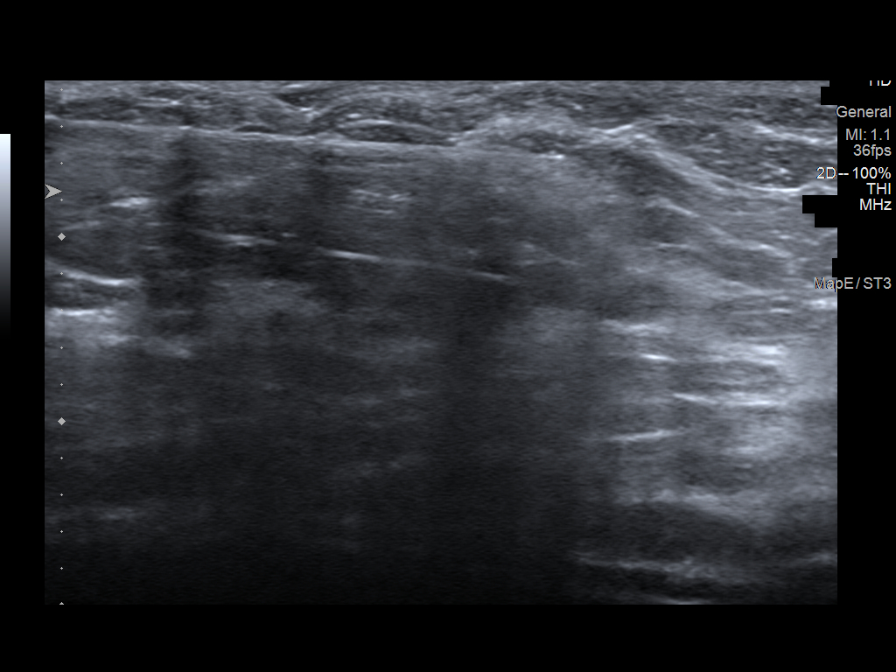
[im 5/7]
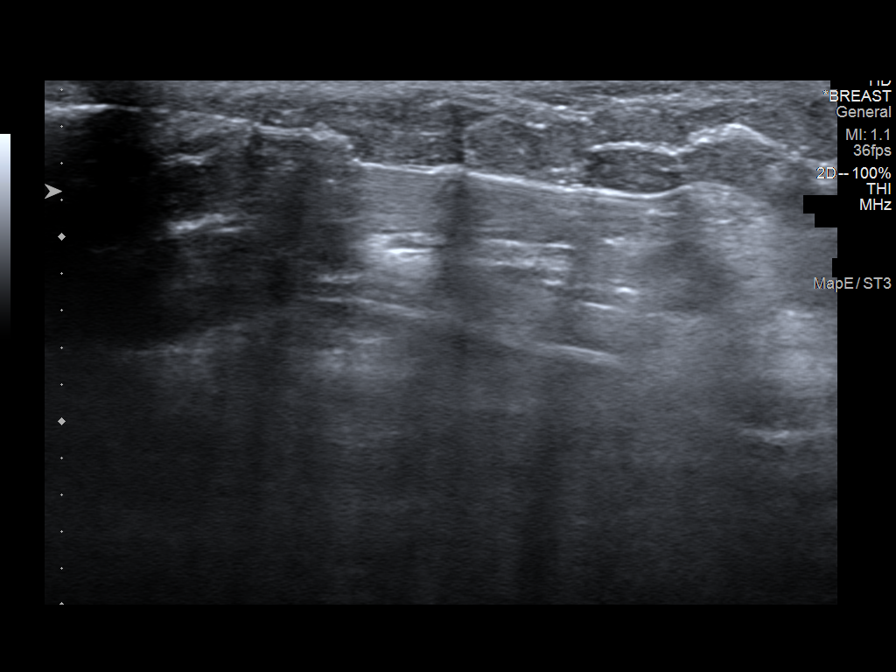
[im 6/7]
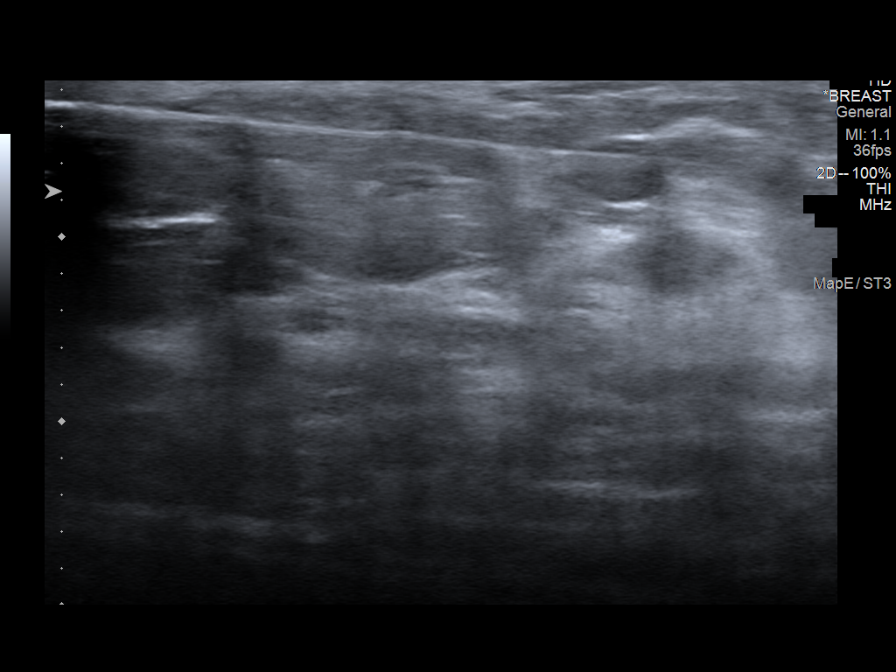
[im 7/7]
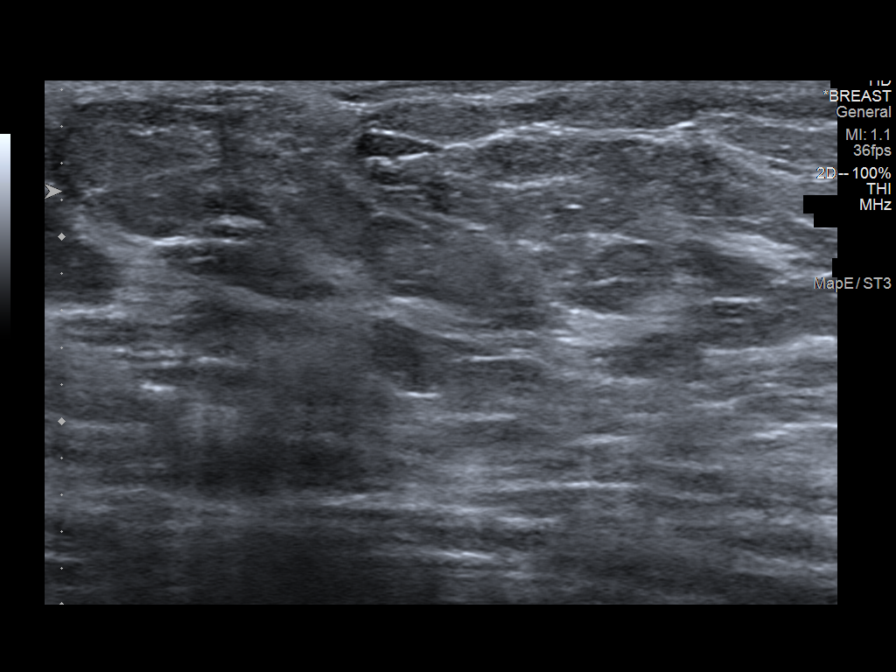

[7 of 7 positions shown; findings below may reference images not displayed]



Lesion quadrant: Upper inner quadrant

Using sterile technique and 1% Lidocaine as local anesthetic, under
direct ultrasound visualization, a 14 gauge TOBA device was
used to perform biopsy of right breast mass 2 o'clock position using
a medial approach. At the conclusion of the procedure coil shaped
tissue marker clip was deployed into the biopsy cavity. Follow up 2
view mammogram was performed and dictated separately.
IMPRESSION: Ultrasound guided biopsy of right breast mass. No apparent
complications.

ADDENDUM:
Pathology revealed FIBROADENOMA of the RIGHT breast, 2 o'clock. This
was found to be concordant by Dr. TOBA.

Pathology results were discussed with the patient by telephone. The
patient reported doing well after the biopsy with tenderness at the
site. Post biopsy instructions and care were reviewed and questions
were answered. The patient was encouraged to call The [REDACTED] for any additional concerns. My direct phone
number was provided.

The patient is scheduled for LEFT MRI guided biopsy x 2 on [DATE]. Further recommendations will be guided by the results of
these biopsies.

Pathology results reported by TOBA, RN on [DATE].



Lesion quadrant: Upper inner quadrant

Using sterile technique and 1% Lidocaine as local anesthetic, under
direct ultrasound visualization, a 14 gauge TOBA device was
used to perform biopsy of right breast mass 2 o'clock position using
a medial approach. At the conclusion of the procedure coil shaped
tissue marker clip was deployed into the biopsy cavity. Follow up 2
view mammogram was performed and dictated separately.
IMPRESSION: Ultrasound guided biopsy of right breast mass. No apparent
complications.

## 2020-07-11 IMAGING — US US BREAST*R* LIMITED INC AXILLA
1 series · 4 of 4 positions shown · non-contrast
Comparison: Previous exam(s).

CLINICAL DATA: Patient for second-look ultrasound of mass
identified within the right breast on recent high risk screening
MRI.

EXAM:
ULTRASOUND OF THE RIGHT BREAST

[Series 1: us breast*right* limited inc axilla · 0.06mm/px · 4 of 4 slices shown]
[im 1/4]
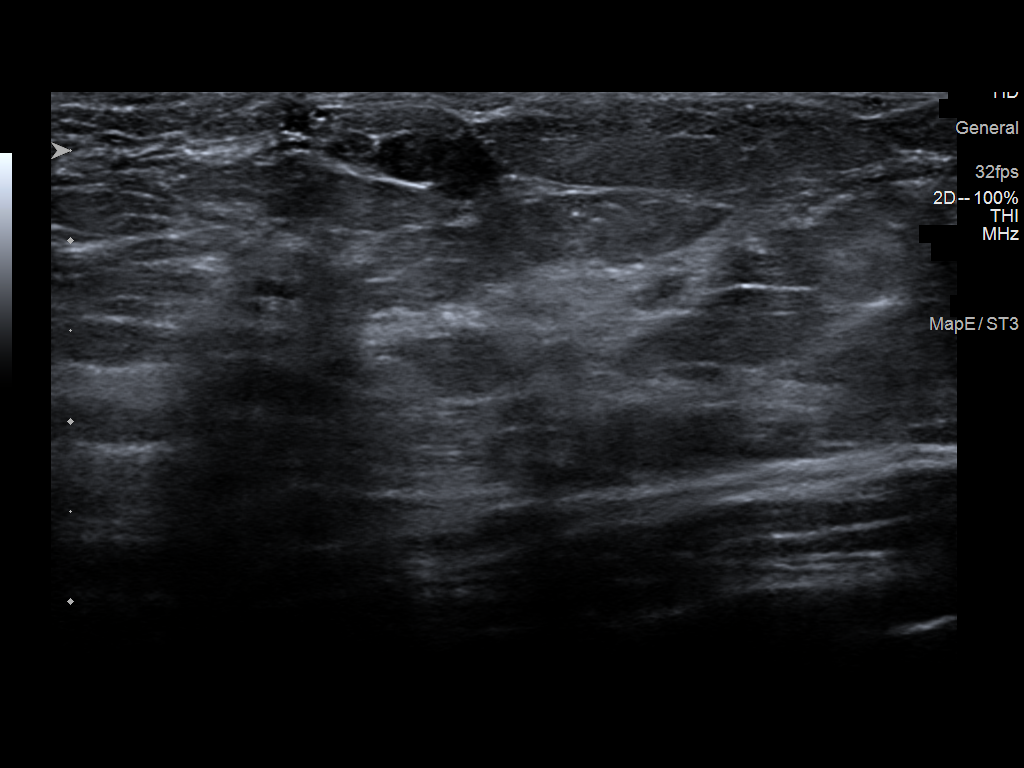
[im 2/4]
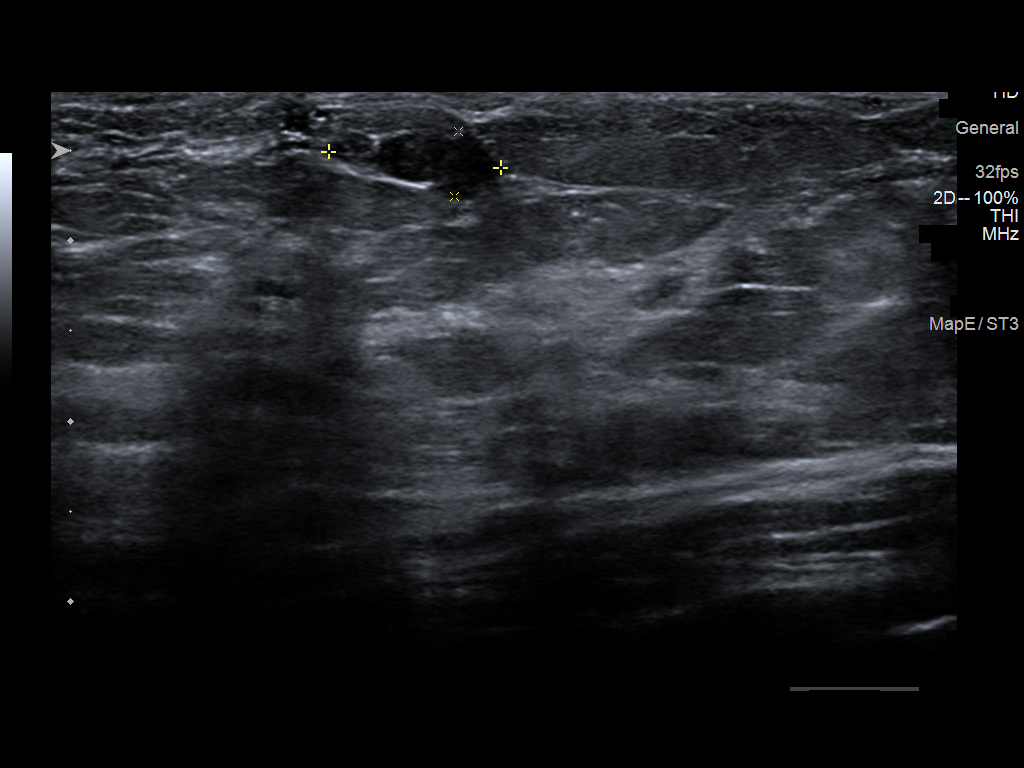
[im 3/4]
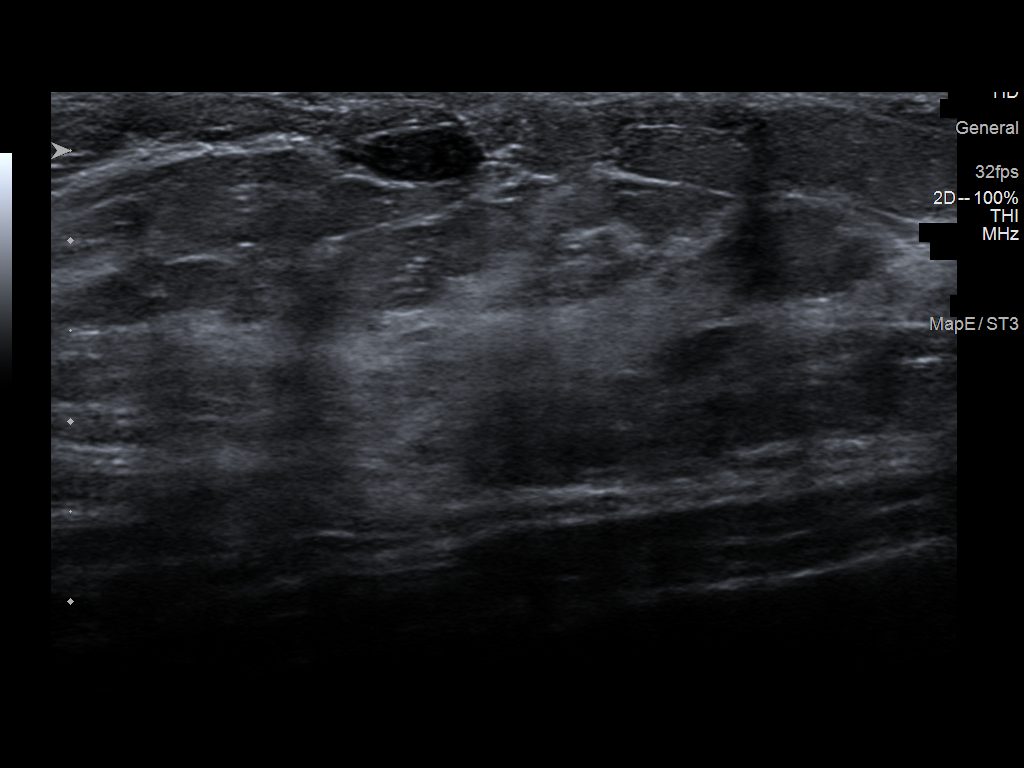
[im 4/4]
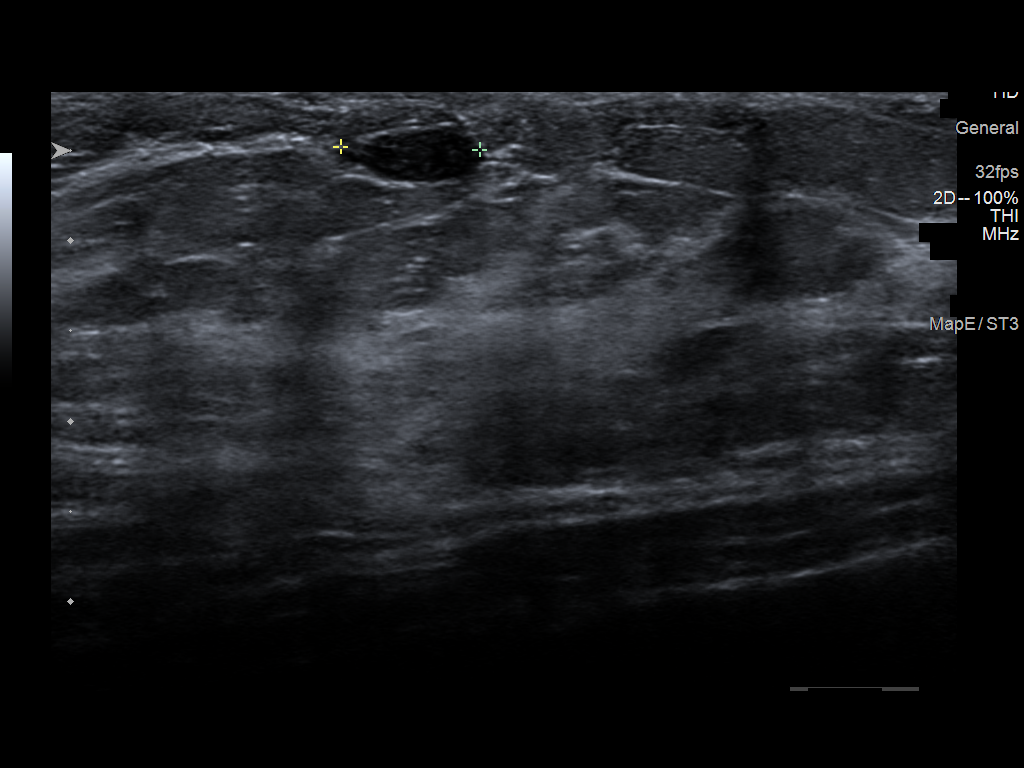

[4 of 4 positions shown; findings below may reference images not displayed]

FINDINGS: On physical exam, no discrete retroareolar mass is palpated.

Targeted ultrasound is performed, showing there is a 1.0 x 0.4 x
cm lobular hypoechoic mass right breast 2 o'clock position 1 cm from
the nipple corresponding with the mass identified on recent high
risk screening MRI.
IMPRESSION: Indeterminate right breast mass 2 o'clock position.

RECOMMENDATION:
Ultrasound-guided core needle biopsy right breast mass 2 o'clock
position.

I have discussed the findings and recommendations with the patient.
If applicable, a reminder letter will be sent to the patient
regarding the next appointment.

BI-RADS CATEGORY  4: Suspicious.

## 2020-07-11 IMAGING — MG MM BREAST LOCALIZATION CLIP
4 series · 4 of 12 positions shown · non-contrast
Comparison: Previous exam(s).

CLINICAL DATA: Patient status post ultrasound-guided biopsy right
breast mass.

EXAM:
DIAGNOSTIC RIGHT MAMMOGRAM POST ULTRASOUND BIOPSY

[R CC synth-2D]
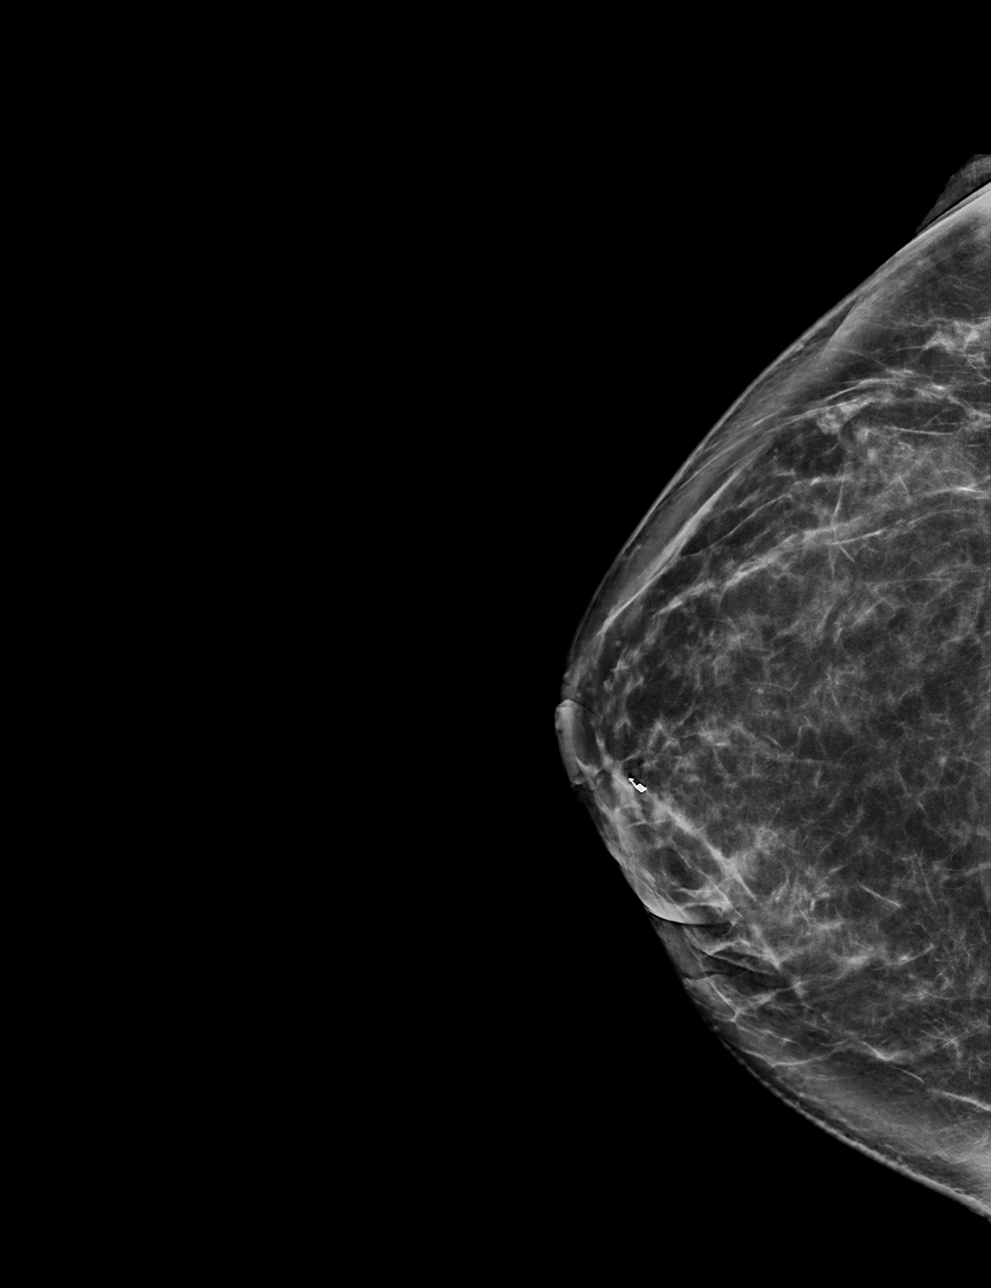

[R ML synth-2D]
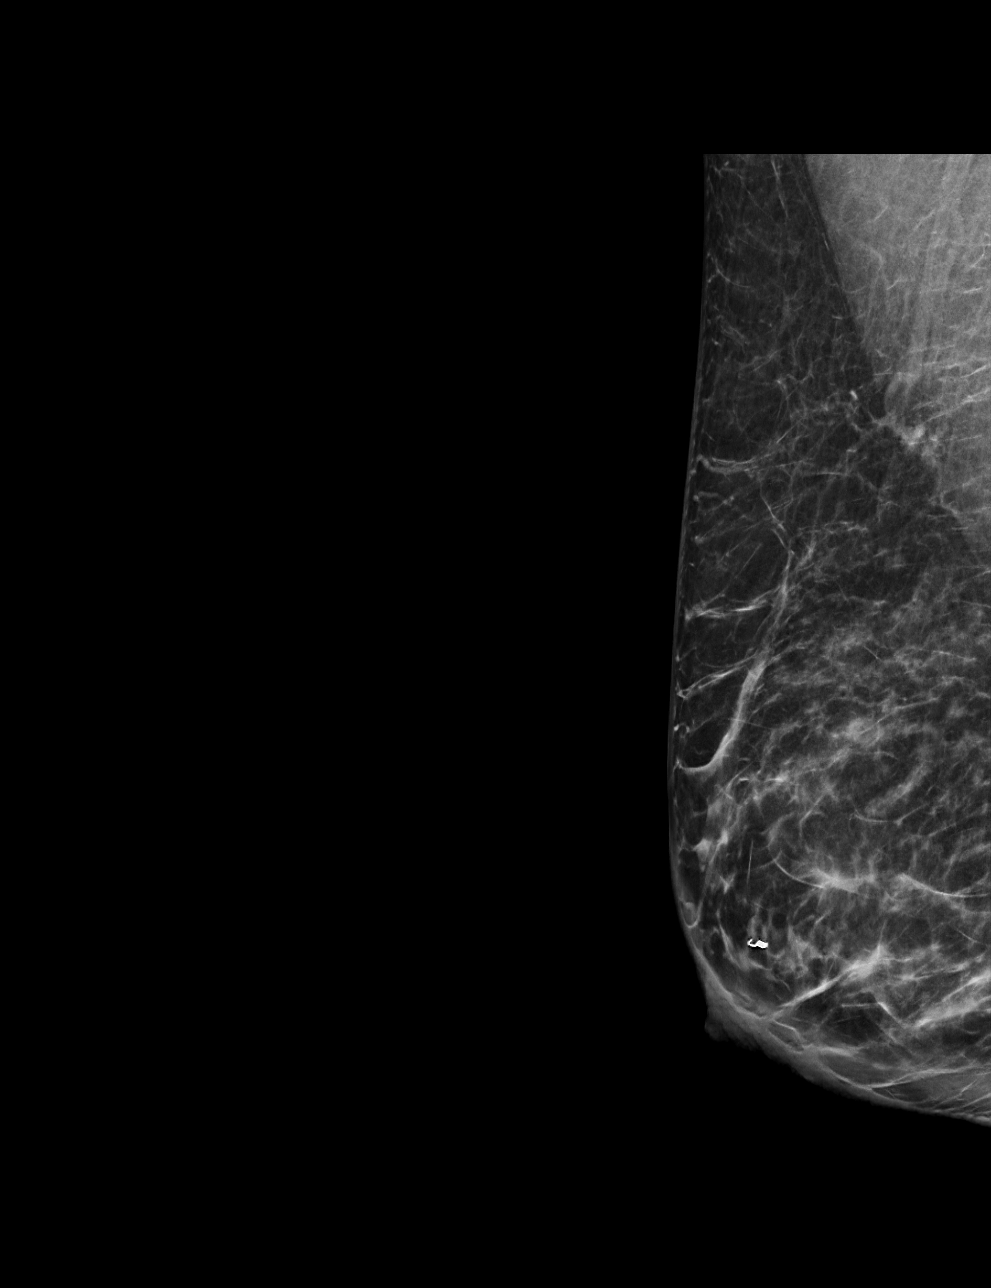

[R CC tomo · tomo slice 43/84.0]
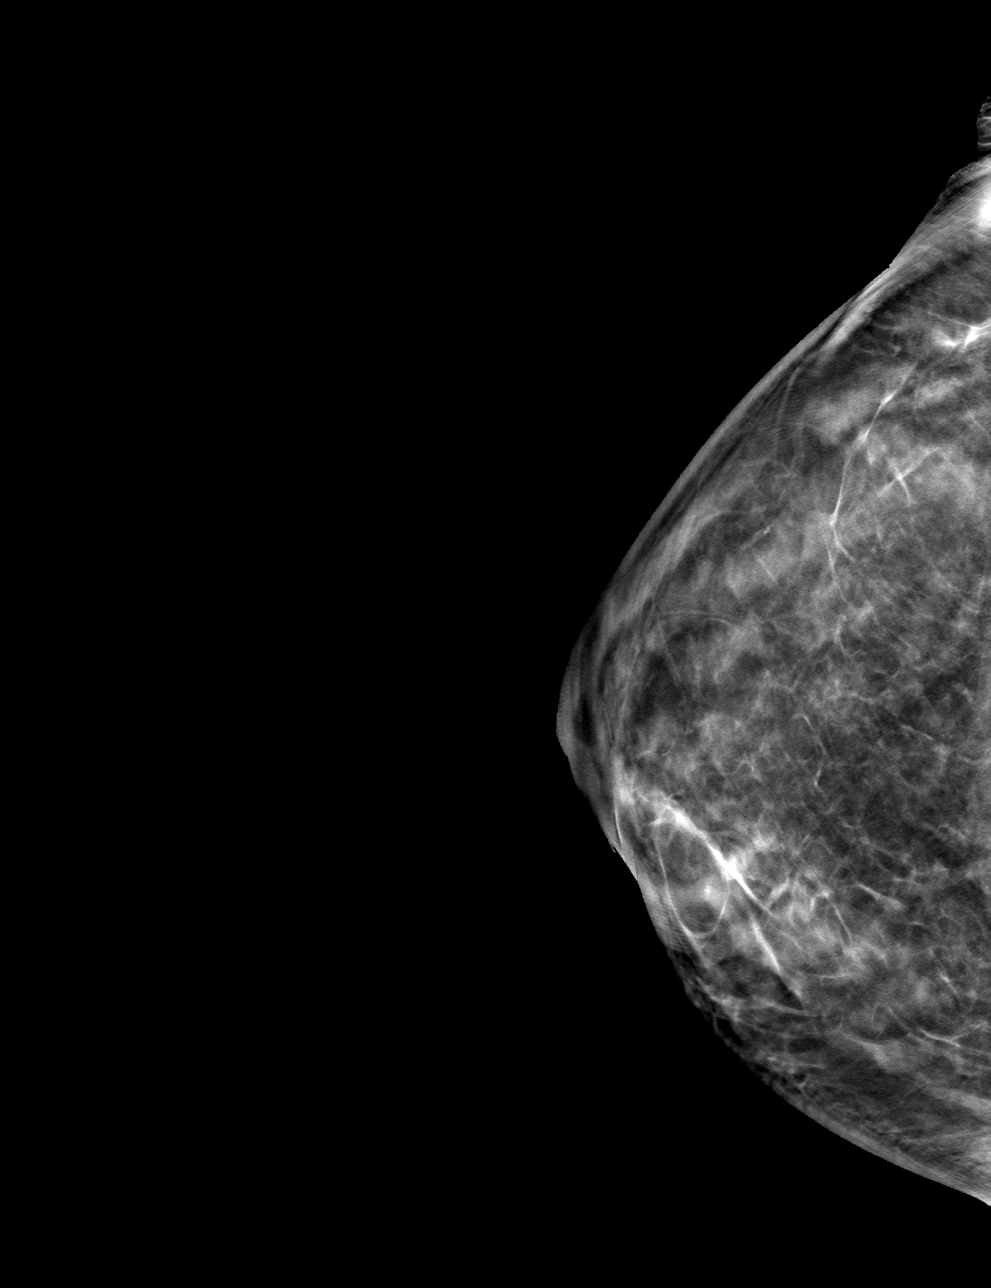

[R ML tomo · tomo slice 37/73.0]
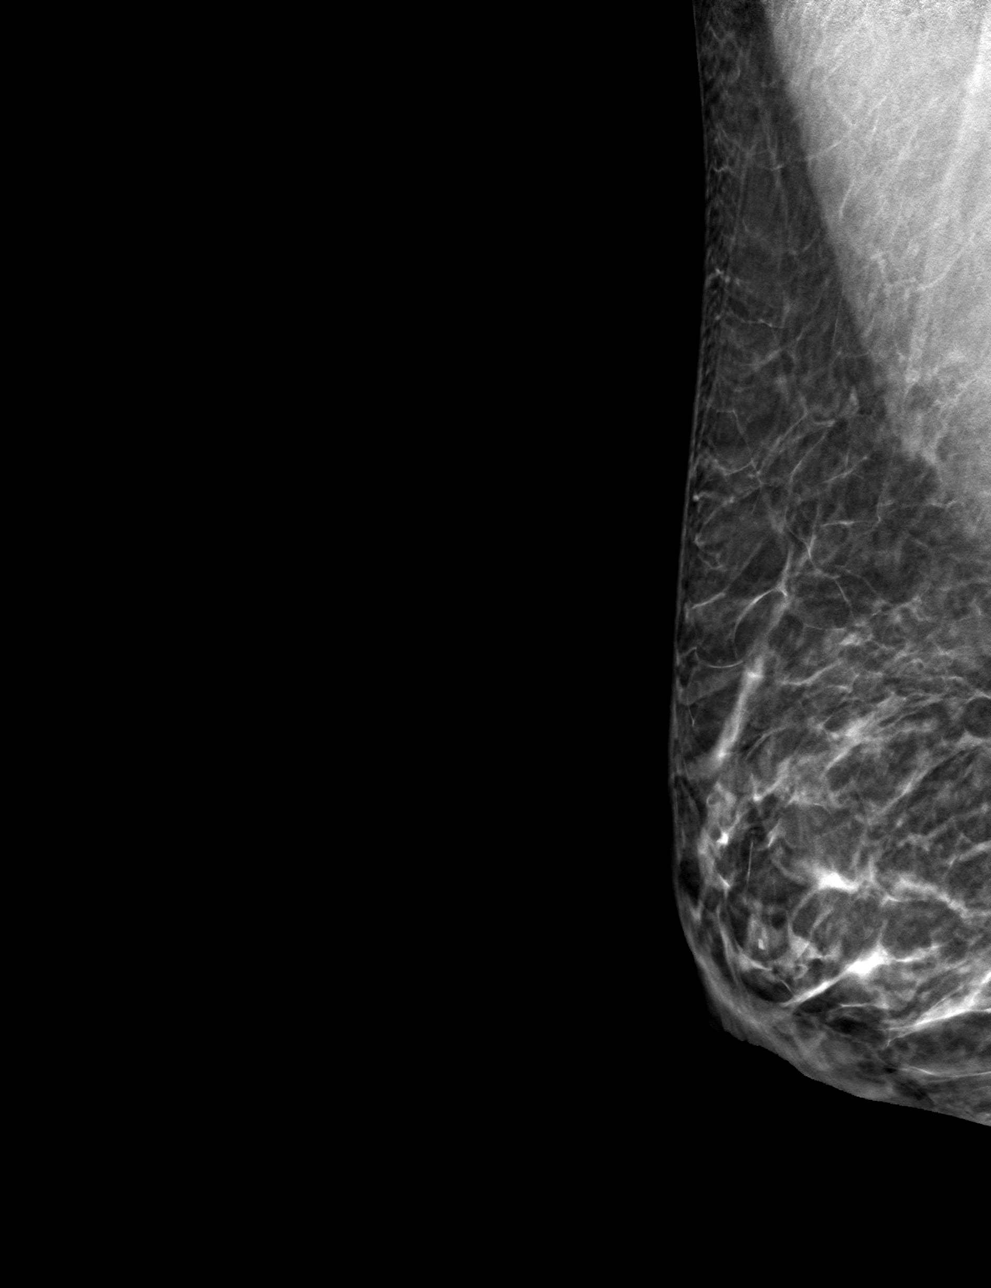

[4 of 12 positions shown; findings below may reference images not displayed]

FINDINGS: Mammographic images were obtained following ultrasound guided biopsy
of right breast mass 2 o'clock position. The biopsy marking clip is
in expected position at the site of biopsy.
IMPRESSION: Appropriate positioning of the coil shaped biopsy marking clip at
the site of biopsy in the right breast 2 o'clock position.

Final Assessment: Post Procedure Mammograms for Marker Placement

## 2020-07-13 IMAGING — MR MR BREAST BX W LOC DEV 1ST LESION IMAGE BX SPEC MR GUIDE*L*
9 of 16 series · 25 of 48 positions shown · IV contrast (7ml Gadavist)
Comparison: Previous exams.
COMPARISON: Previous exams.
COMPARISON: Previous exams.

Addendum:
CLINICAL DATA: 44-year-old female presents for MR guided biopsy of
UPPER OUTER LEFT breast non masslike enhancement and LOWER LEFT
breast non masslike enhancement. After preprocedure imaging today,
the LOWER LEFT breast non masslike enhancement is not visualized as
discussed below, and only the UPPER OUTER LEFT breast non masslike
enhancement was biopsied.

EXAM:
MRI GUIDED CORE NEEDLE BIOPSY OF THE LEFT BREAST
TECHNIQUE: Multiplanar, multisequence MR imaging of the LEFT breast was
performed both before and after administration of intravenous
contrast.
CONTRAST:  7mL GADAVIST GADOBUTROL 1 MMOL/ML IV SOLN

[Series 2: fiducial unilateral · sagittal · 2.0mm · 1.33mm/px · 2 of 52 slices shown]
[im 1/52]
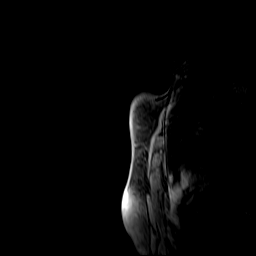
[im 52/52]
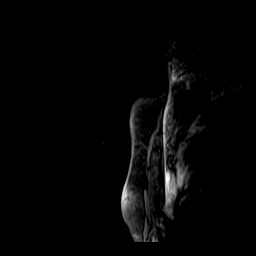

[Series 3: dynamic pre · axial · non-contrast · 1.3mm · 0.73mm/px · z∈[-43,+143]mm · 4 of 144 slices shown]
[im 1/144]
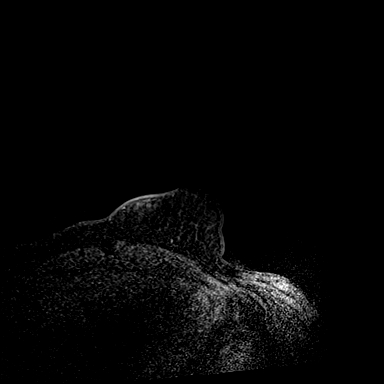
[im 48/144]
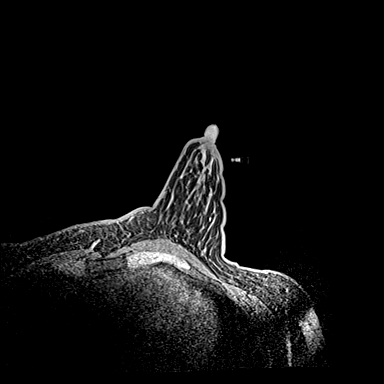
[im 96/144]
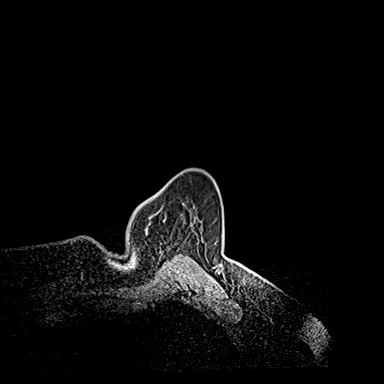
[im 144/144]
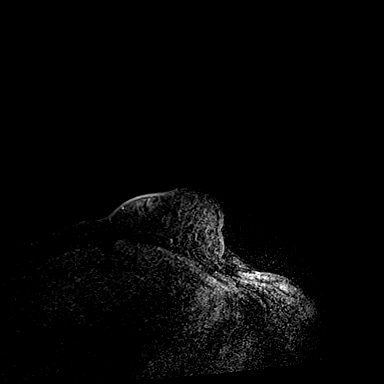

[Series 4: dynamic post 20 · axial · 1.3mm · 0.73mm/px · z∈[-43,+143]mm · 3 of 144 slices shown (1 of 2)]
[im 1/144]
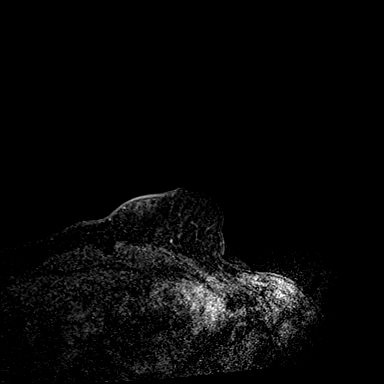
[im 72/144]
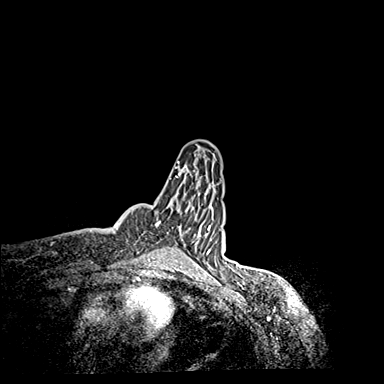
[im 144/144]
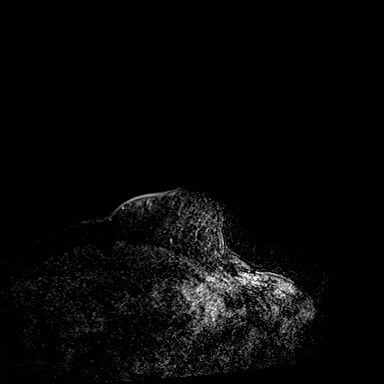

[Series 5: dynamic post 20 · axial · 1.3mm · 0.73mm/px · z∈[-43,+143]mm · 3 of 144 slices shown (2 of 2)]
[im 1/144]
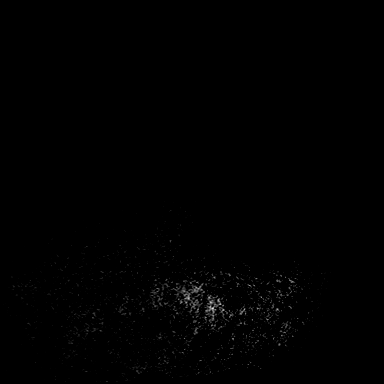
[im 72/144]
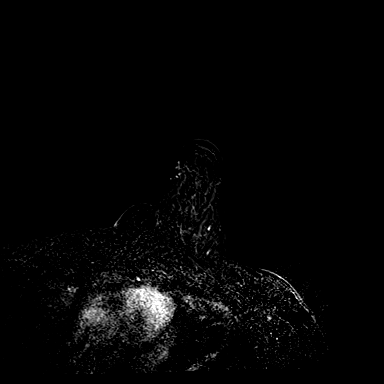
[im 144/144]
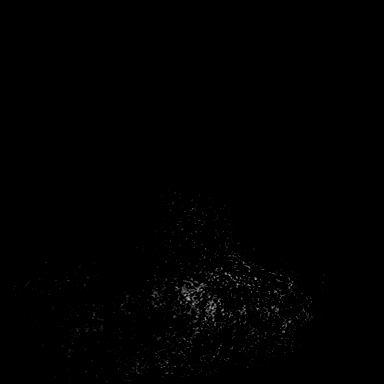

[Series 6: dynamic post 3 · axial · 1.3mm · 0.73mm/px · z∈[-43,+143]mm · 3 of 144 slices shown (1 of 2)]
[im 1/144]
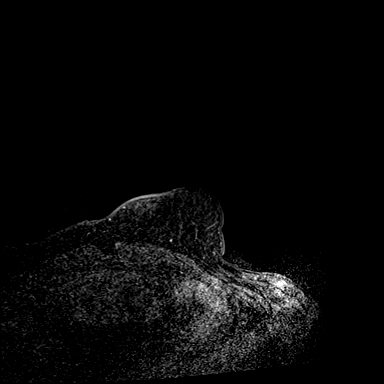
[im 72/144]
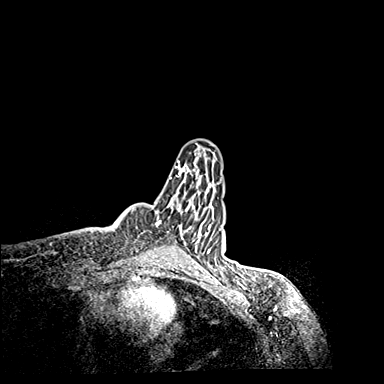
[im 144/144]
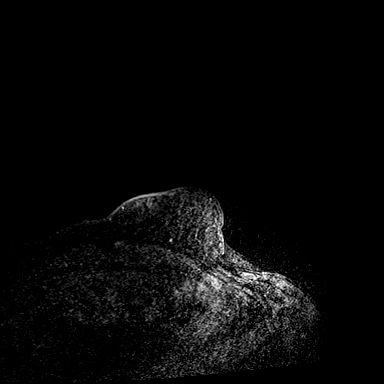

[Series 7: dynamic post 3 · axial · 1.3mm · 0.73mm/px · z∈[-43,+143]mm · 3 of 144 slices shown (2 of 2)]
[im 1/144]
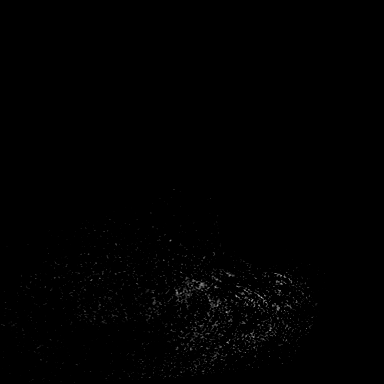
[im 72/144]
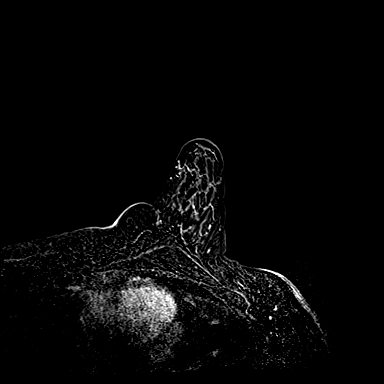
[im 144/144]
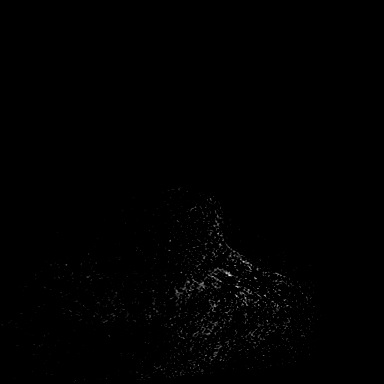

[Series 8: dynamic post 5 · axial · 1.3mm · 0.73mm/px · z∈[-43,+143]mm · 3 of 144 slices shown (1 of 2)]
[im 1/144]
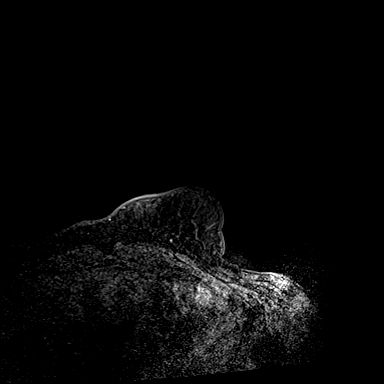
[im 72/144]
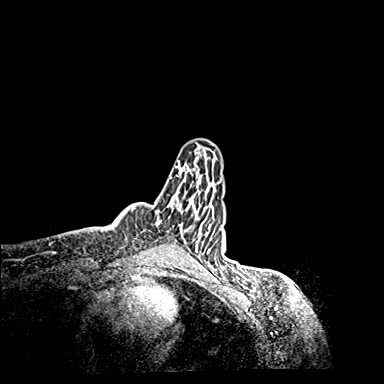
[im 144/144]
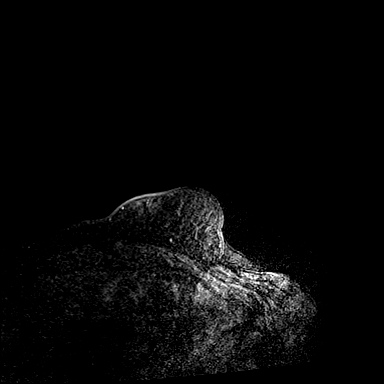

[Series 9: dynamic post 5 · axial · 1.3mm · 0.73mm/px · z∈[-43,+143]mm · 3 of 144 slices shown (2 of 2)]
[im 1/144]
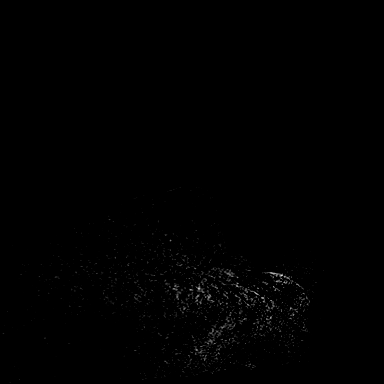
[im 72/144]
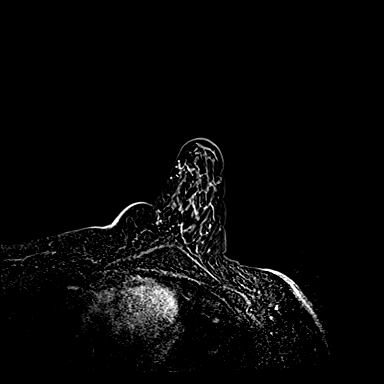
[im 144/144]
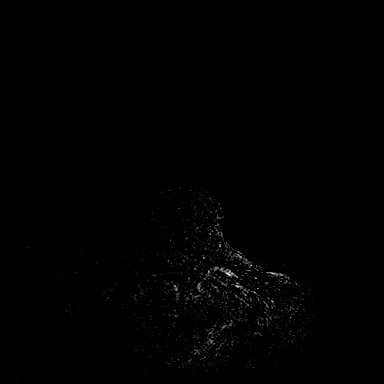

[Series 10: dynamic post 10 · axial · 1.3mm · 0.73mm/px · 1 of 144 slices shown]
[im 1/144]
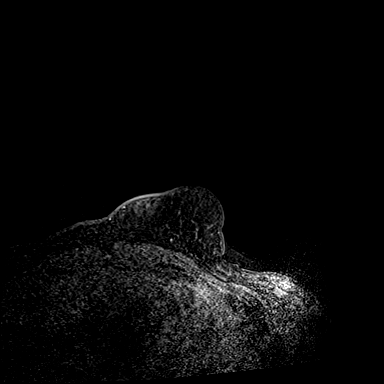

[25 of 48 positions shown; findings below may reference images not displayed]

FINDINGS: I met with the patient, and we discussed the procedure of MRI guided
biopsy, including risks, benefits, and alternatives. Specifically,
we discussed the risks of infection, bleeding, tissue injury, clip
migration, and inadequate sampling. Informed, written consent was
given. The usual time out protocol was performed immediately prior
to the procedure.

The focal non masslike enhancement within the posterior LOWER LEFT
breast identified on [DATE] MR is not visualized even on very
delayed sequences taken [DATE] minutes postcontrast
administration. Therefore, no biopsy in this area was performed.

Using sterile technique, 1% Lidocaine, MRI guidance, and a 9 gauge
vacuum assisted device, biopsy was performed of non masslike
enhancement within the UPPER-OUTER LEFT breast using a LATERAL
approach.

At the conclusion of the procedure, a BARBELL tissue marker clip was
deployed into the biopsy cavity. It was uncertain if the area in
question was adequately sampled on immediate postprocedure MR so
additional deeper samples were obtained and a 2nd BARBELL tissue
marker clip was deployed into the biopsy cavity. Follow-up 2-view
mammogram was performed and dictated separately, but on those images
only 1 BARBELL tissue marker clip is present consistent with the 1st
BARBELL clip being removed on additional sampling.

Oozing at the skin site was noted so a hemostasis pad was applied.
Post biopsy images demonstrate mild to moderate post biopsy
hemorrhage.
IMPRESSION: 1. MR guided biopsy of UPPER-OUTER LEFT breast non masslike
enhancement. Mild to moderate post biopsy hemorrhage.
2. Posterior LOWER LEFT breast non masslike enhancement identified
on recent MR is not visualized today and therefore not sampled.
Six-month MR follow-up is recommended per protocol.

ADDENDUM:
The patient returned to the [REDACTED] at 3 p.m. on [DATE]
due to continued bleeding from the biopsy site.

Bruising and oozing at the LEFT breast biopsy site was noted by
nursing.

A Quik-clot pad was placed at the skin entry site and a pressure
bandage was applied. She was instructed not to remove the Quik-clot
pad for least 24 hours.

The patient's blood pressure was measured at 124/80 and pulse was
70. The patient was in no distress at discharge. She was instructed
to contact us if bleeding continued or she had any other issues with
the LEFT breast.

ADDENDUM:
Pathology revealed BENIGN BREAST TISSUE. NO EVIDENCE OF MALIGNANCY
of the LEFT breast, upper outer quadrant. This was found to be
concordant by Dr. ZERON.

Pathology results were discussed with the patient by telephone. The
patient reported doing well after the pressure dressing and bandage
applied [REDACTED] afternoon. Bandage removed by patient yesterday with
minimal spot of blood noted. She is using ice pack as directed for
breast tenderness at the site. Post biopsy instructions and care
were reviewed and questions were answered. The patient was
encouraged to call The [REDACTED] for any
additional concerns.

Bilateral breast MRI recommended in 6 months per protocol.

Pathology results reported by ZERON RN on [DATE].

*** End of Addendum ***
Addendum:
FINDINGS: I met with the patient, and we discussed the procedure of MRI guided
biopsy, including risks, benefits, and alternatives. Specifically,
we discussed the risks of infection, bleeding, tissue injury, clip
migration, and inadequate sampling. Informed, written consent was
given. The usual time out protocol was performed immediately prior
to the procedure.

The focal non masslike enhancement within the posterior LOWER LEFT
breast identified on [DATE] MR is not visualized even on very
delayed sequences taken [DATE] minutes postcontrast
administration. Therefore, no biopsy in this area was performed.

Using sterile technique, 1% Lidocaine, MRI guidance, and a 9 gauge
vacuum assisted device, biopsy was performed of non masslike
enhancement within the UPPER-OUTER LEFT breast using a LATERAL
approach.

At the conclusion of the procedure, a BARBELL tissue marker clip was
deployed into the biopsy cavity. It was uncertain if the area in
question was adequately sampled on immediate postprocedure MR so
additional deeper samples were obtained and a 2nd BARBELL tissue
marker clip was deployed into the biopsy cavity. Follow-up 2-view
mammogram was performed and dictated separately, but on those images
only 1 BARBELL tissue marker clip is present consistent with the 1st
BARBELL clip being removed on additional sampling.

Oozing at the skin site was noted so a hemostasis pad was applied.
Post biopsy images demonstrate mild to moderate post biopsy
hemorrhage.
IMPRESSION: 1. MR guided biopsy of UPPER-OUTER LEFT breast non masslike
enhancement. Mild to moderate post biopsy hemorrhage.
2. Posterior LOWER LEFT breast non masslike enhancement identified
on recent MR is not visualized today and therefore not sampled.
Six-month MR follow-up is recommended per protocol.

ADDENDUM:
The patient returned to the [REDACTED] at 3 p.m. on [DATE]
due to continued bleeding from the biopsy site.

Bruising and oozing at the LEFT breast biopsy site was noted by
nursing.

A Quik-clot pad was placed at the skin entry site and a pressure
bandage was applied. She was instructed not to remove the Quik-clot
pad for least 24 hours.

The patient's blood pressure was measured at 124/80 and pulse was
70. The patient was in no distress at discharge. She was instructed
to contact us if bleeding continued or she had any other issues with
the LEFT breast.

*** End of Addendum ***
FINDINGS: I met with the patient, and we discussed the procedure of MRI guided
biopsy, including risks, benefits, and alternatives. Specifically,
we discussed the risks of infection, bleeding, tissue injury, clip
migration, and inadequate sampling. Informed, written consent was
given. The usual time out protocol was performed immediately prior
to the procedure.

The focal non masslike enhancement within the posterior LOWER LEFT
breast identified on [DATE] MR is not visualized even on very
delayed sequences taken [DATE] minutes postcontrast
administration. Therefore, no biopsy in this area was performed.

Using sterile technique, 1% Lidocaine, MRI guidance, and a 9 gauge
vacuum assisted device, biopsy was performed of non masslike
enhancement within the UPPER-OUTER LEFT breast using a LATERAL
approach.

At the conclusion of the procedure, a BARBELL tissue marker clip was
deployed into the biopsy cavity. It was uncertain if the area in
question was adequately sampled on immediate postprocedure MR so
additional deeper samples were obtained and a 2nd BARBELL tissue
marker clip was deployed into the biopsy cavity. Follow-up 2-view
mammogram was performed and dictated separately, but on those images
only 1 BARBELL tissue marker clip is present consistent with the 1st
BARBELL clip being removed on additional sampling.

Oozing at the skin site was noted so a hemostasis pad was applied.
Post biopsy images demonstrate mild to moderate post biopsy
hemorrhage.
IMPRESSION: 1. MR guided biopsy of UPPER-OUTER LEFT breast non masslike
enhancement. Mild to moderate post biopsy hemorrhage.
2. Posterior LOWER LEFT breast non masslike enhancement identified
on recent MR is not visualized today and therefore not sampled.
Six-month MR follow-up is recommended per protocol.

## 2020-07-13 IMAGING — MG MM BREAST LOCALIZATION CLIP
4 series · 4 of 12 positions shown · non-contrast
Comparison: Previous exam(s).

CLINICAL DATA: Evaluate BARBELL clip placement following MR guided
LEFT breast biopsy.

EXAM:
DIAGNOSTIC LEFT MAMMOGRAM POST MRI BIOPSY

[L ML synth-2D]
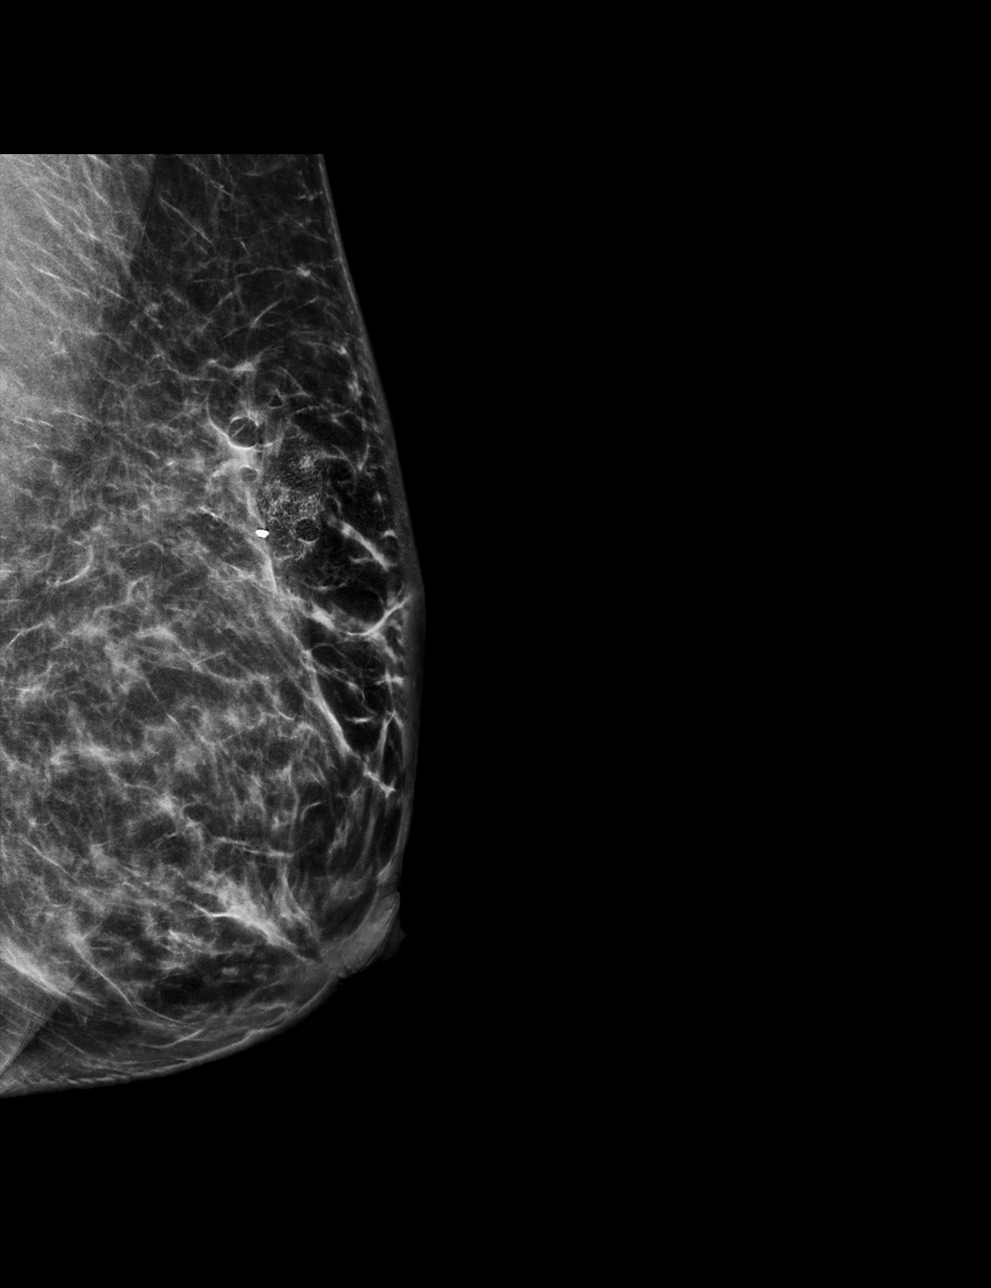

[L CC synth-2D]
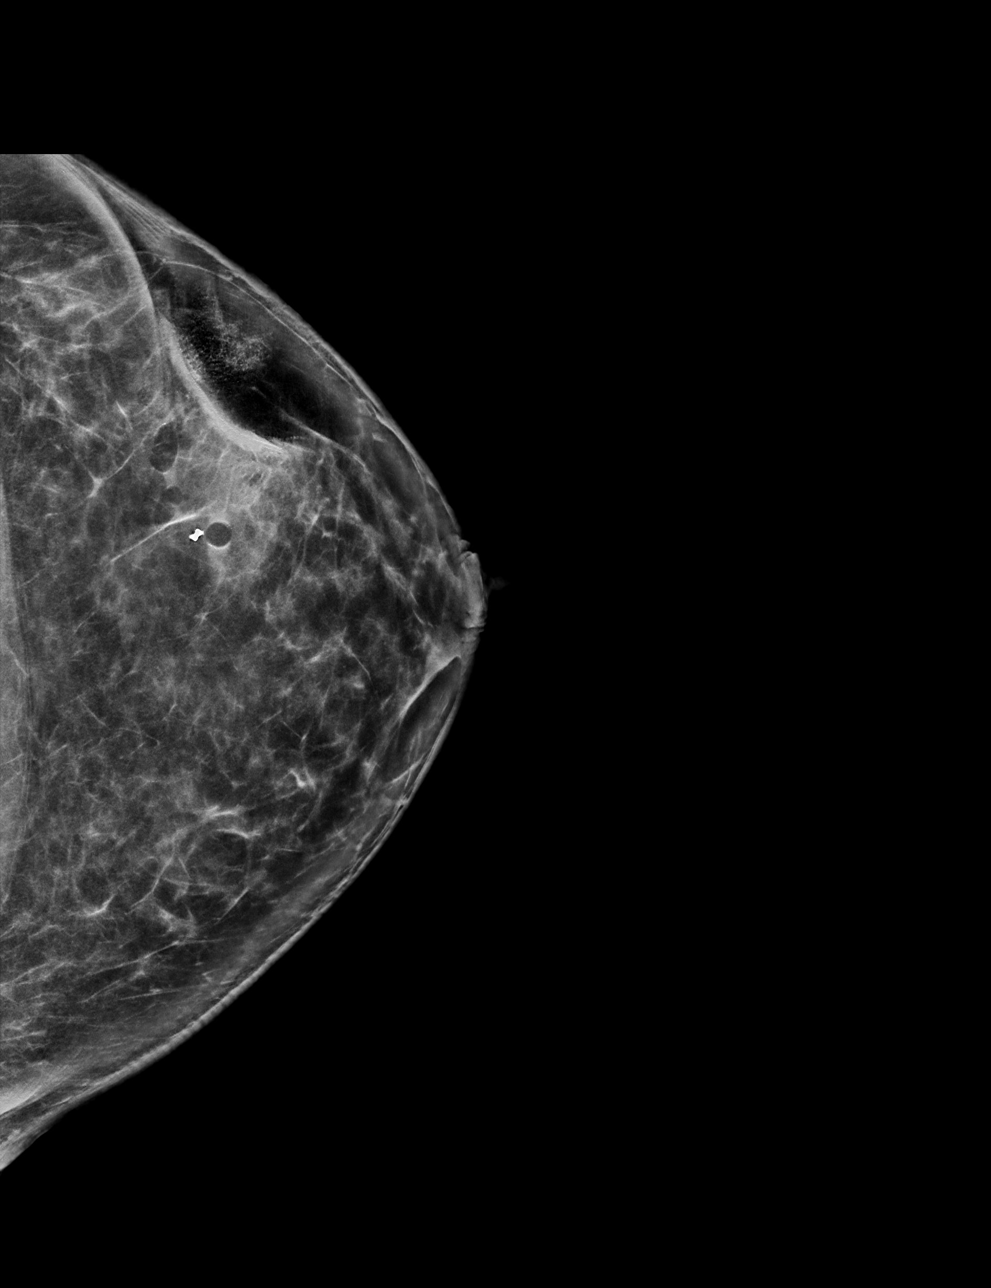

[L CC tomo · tomo slice 38/75.0]
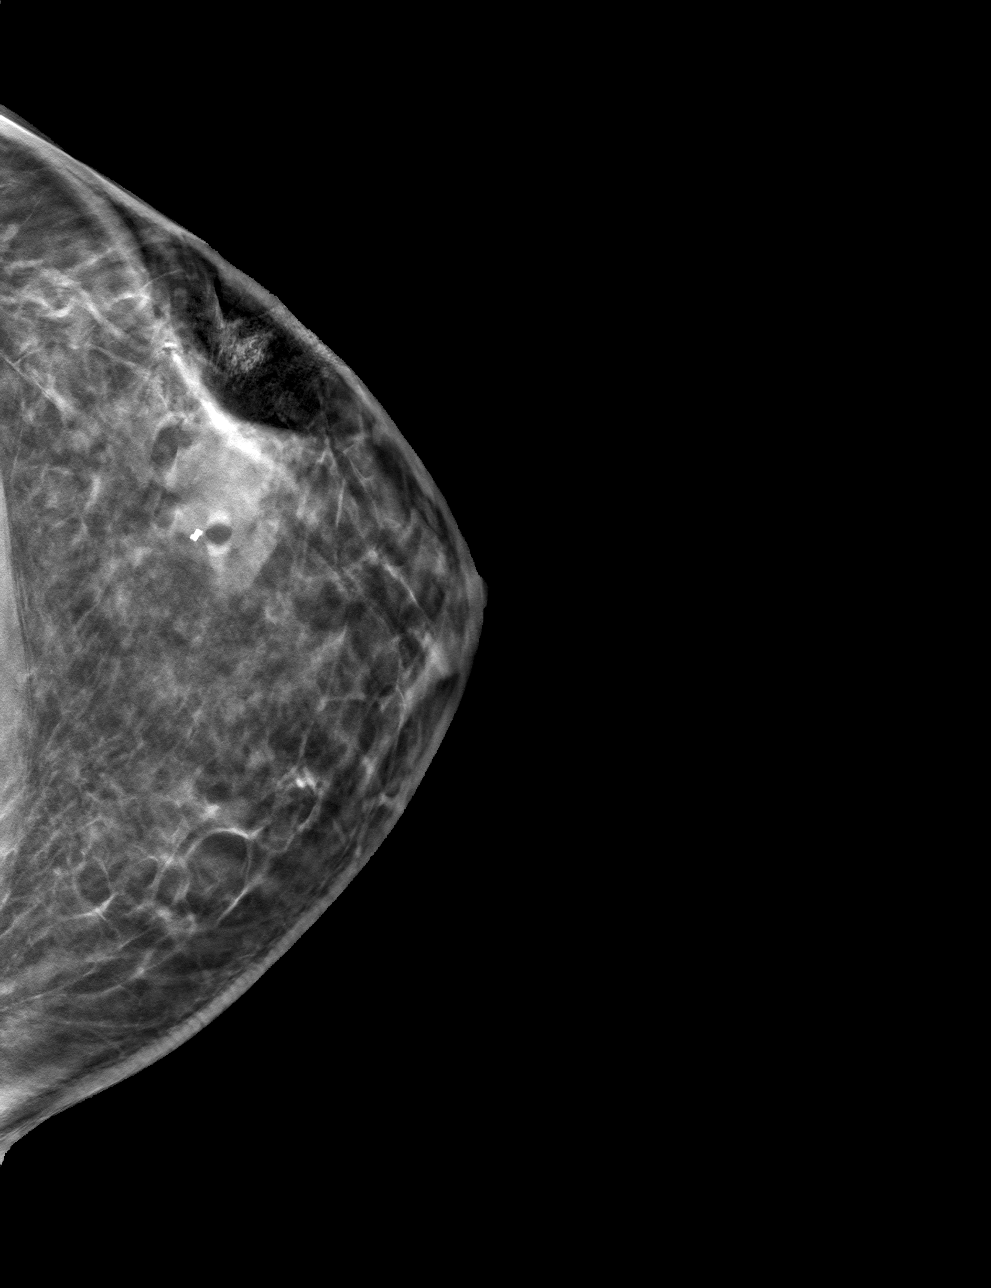

[L ML tomo · tomo slice 34/67.0]
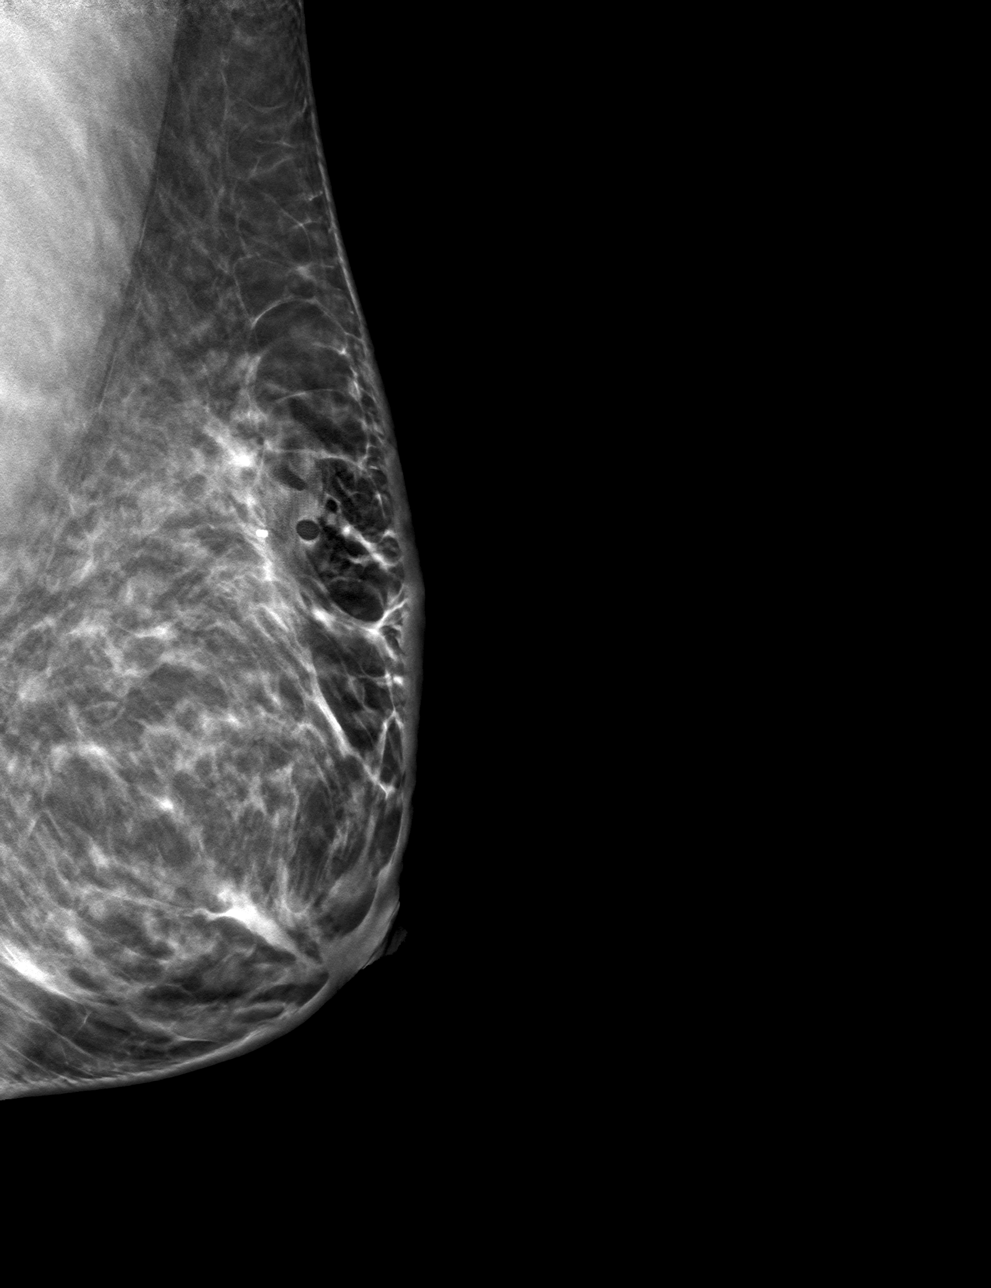

[4 of 12 positions shown; findings below may reference images not displayed]

FINDINGS: Mammographic images were obtained following MR guided biopsy of
UPPER OUTER LEFT breast non masslike enhancement. The BARBELL biopsy
marking clip is in expected position at the site of biopsy.

A small post biopsy hematoma is noted.
IMPRESSION: Appropriate positioning of the BARBELL shaped biopsy marking clip at
the site of biopsy in the UPPER OUTER LEFT breast.

Small post biopsy hematoma.

Final Assessment: Post Procedure Mammograms for Marker Placement

## 2020-07-16 MED FILL — ESCITALOPRAM 20 MG TABLET: 20 | 30 days supply | Qty: 30 | Fill #1

## 2020-07-23 MED FILL — traZODone HCL 100 MG TABS: 100 | 90 days supply | Qty: 90 | Fill #0

## 2020-08-13 MED FILL — ESCITALOPRAM 20 MG TABLET: 20 | 90 days supply | Qty: 90 | Fill #0

## 2020-08-13 MED FILL — traZODone HCL 100 MG TABS: 100 | 30 days supply | Qty: 30 | Fill #1
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
## 2020-09-05 MED FILL — HYDROCODON-APAP 5-325: 5-325 | 5 days supply | Qty: 20 | Fill #0

## 2020-09-11 MED FILL — CYCLOBENZAPRINE HCL 10 MG T: 10 | 10 days supply | Qty: 30 | Fill #0
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
## 2020-10-02 MED FILL — traZODone HCL 100 MG TABS: 100 | 90 days supply | Qty: 90 | Fill #0

## 2020-10-09 MED FILL — CYCLOBENZAPRINE HCL 10 MG T: 10 | 14 days supply | Qty: 40 | Fill #0

## 2020-10-10 MED FILL — METHYLPREDNISOLONE 4 MG TBP: 4 | 6 days supply | Qty: 21 | Fill #0

## 2020-10-10 MED FILL — METHOCARBAMOL 500 MG TABS: 500 | 15 days supply | Qty: 60 | Fill #0

## 2020-10-12 MED FILL — diazePAM 5 MG TABS: 5 | 1 days supply | Qty: 2 | Fill #0

## 2020-10-23 IMAGING — MR MR CERVICAL SPINE W/O CM
5 of 6 series · 32 of 48 positions shown · non-contrast
Comparison: 04/19/2021 cervical radiograph
COMPARISON: [DATE] cervical radiograph

CLINICAL DATA: Neck and back pain for 3 months with bilateral upper
extremity numbness.

EXAM:
MRI CERVICAL SPINE WITHOUT CONTRAST
TECHNIQUE: Multiplanar, multisequence MR imaging of the cervical spine was
performed. No intravenous contrast was administered.

[Series 5: T1 · sagittal · 3.0mm · 0.69mm/px · 6 of 17 slices shown (1 of 2)]
[im 1/17]
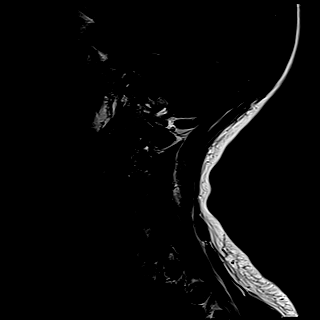
[im 4/17]
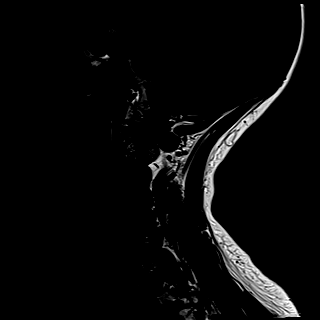
[im 7/17]
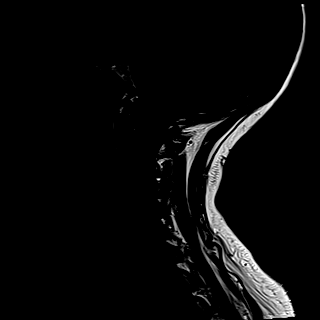
[im 10/17]
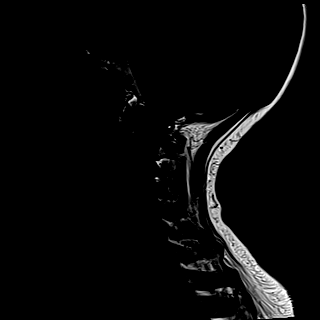
[im 13/17]
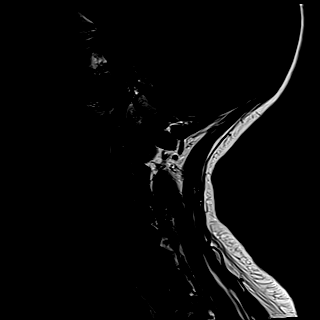
[im 17/17]
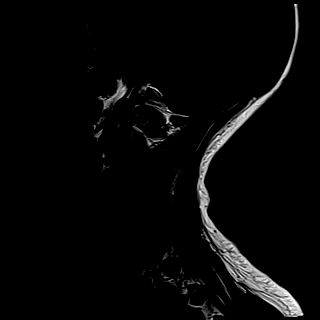

[Series 6: T2 · sagittal · 3.0mm · 0.69mm/px · 5 of 17 slices shown (1 of 2)]
[im 1/17]
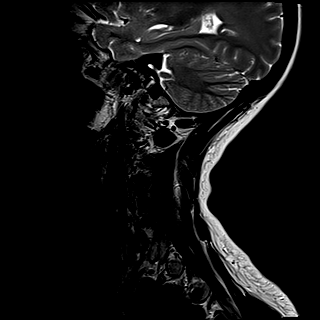
[im 5/17]
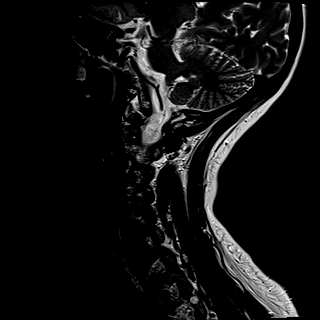
[im 9/17]
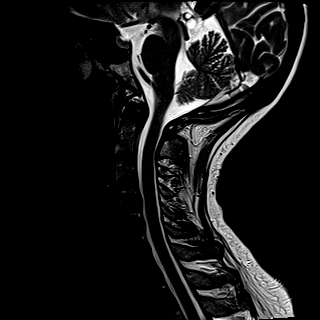
[im 13/17]
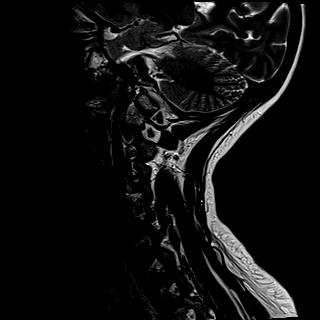
[im 17/17]
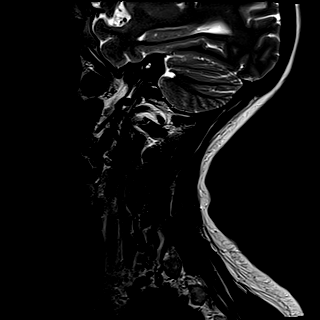

[Series 7: STIR · sagittal · 3.0mm · 0.86mm/px · 2 of 17 slices shown]
[im 1/17]
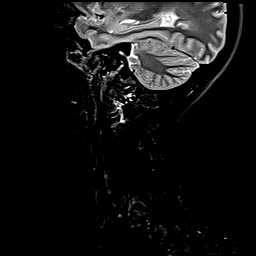
[im 5/17]
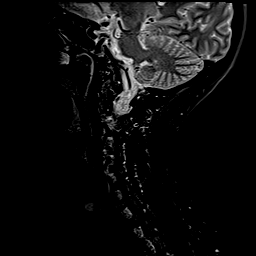

[Series 8: T2 · axial · 3.0mm · 0.70mm/px · z∈[-49,+60]mm · 8 of 32 slices shown (2 of 2)]
[im 1/32]
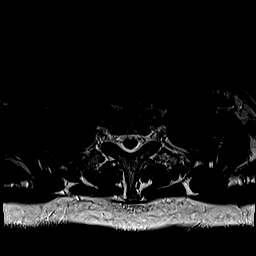
[im 4/32]
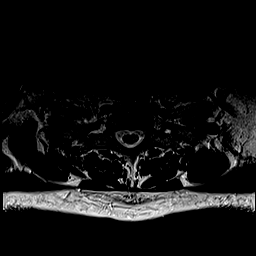
[im 11/32]
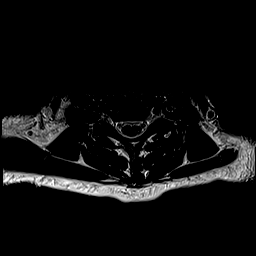
[im 14/32]
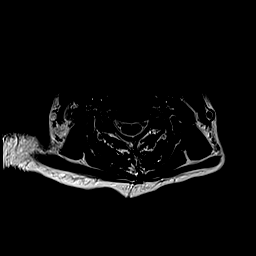
[im 18/32]
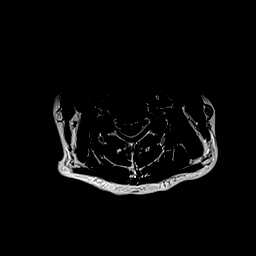
[im 21/32]
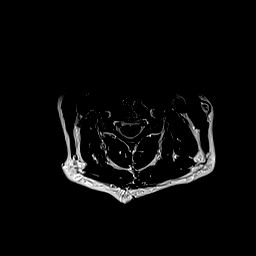
[im 28/32]
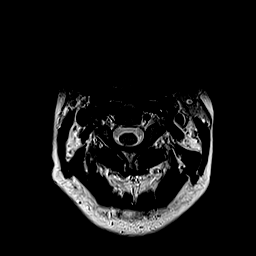
[im 32/32]
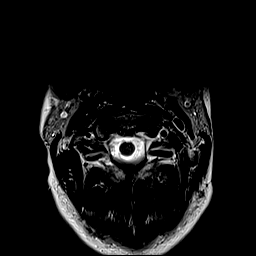

[Series 10: T1 · axial · 3.0mm · 0.35mm/px · z∈[-49,+60]mm · 11 of 33 slices shown (2 of 2)]
[im 1/33]
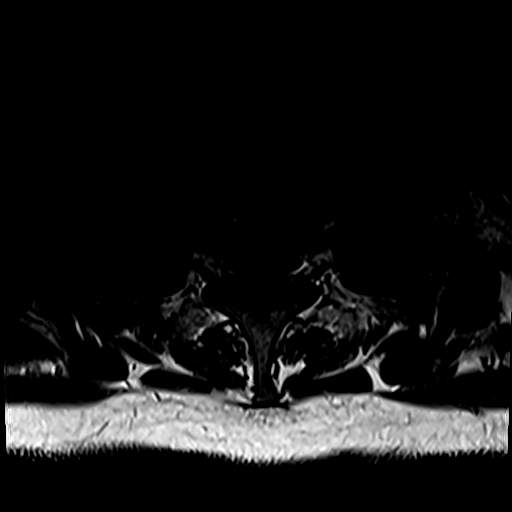
[im 4/33]
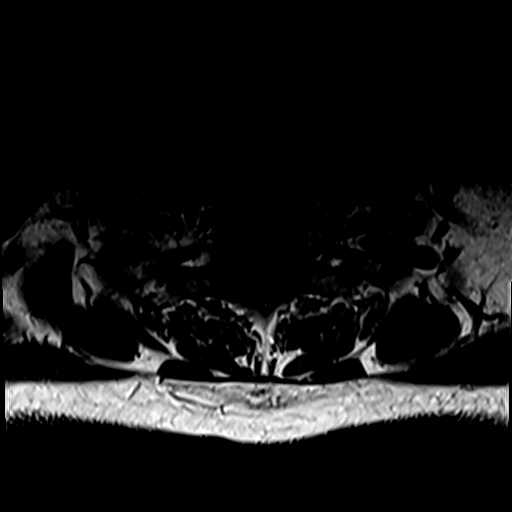
[im 7/33]
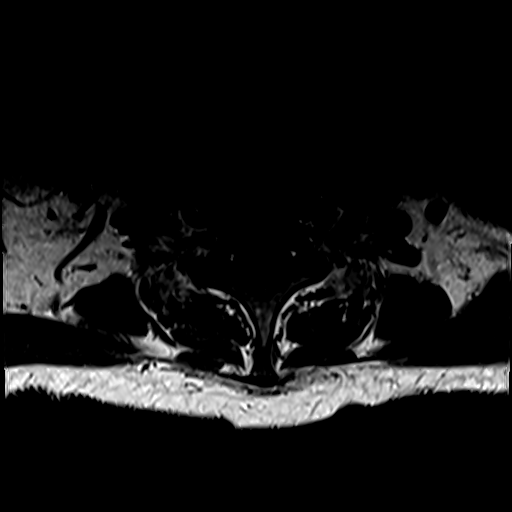
[im 10/33]
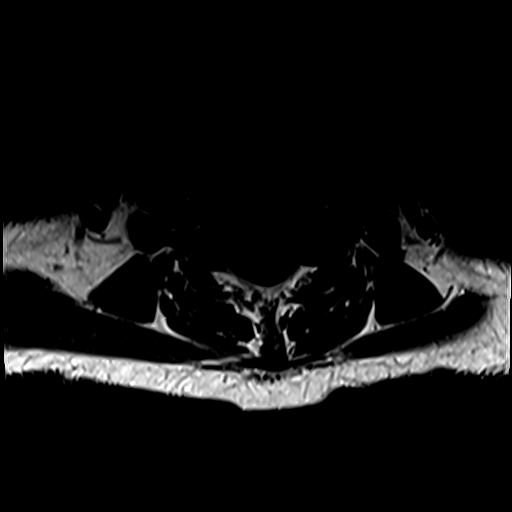
[im 13/33]
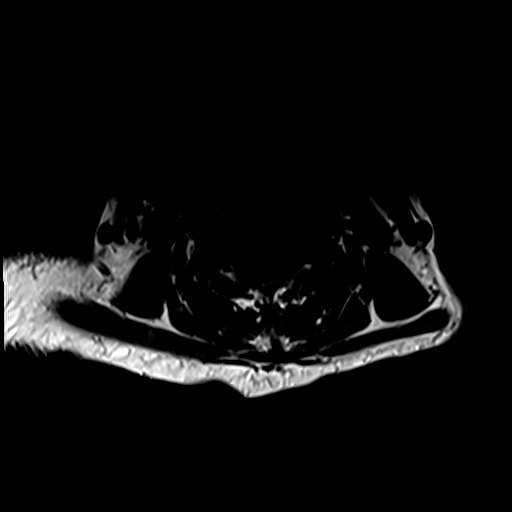
[im 17/33]
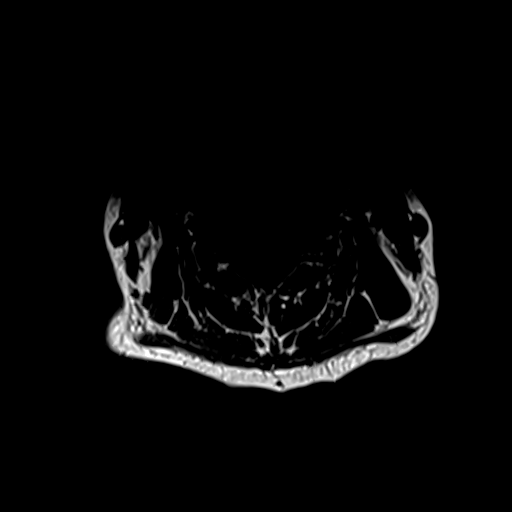
[im 20/33]
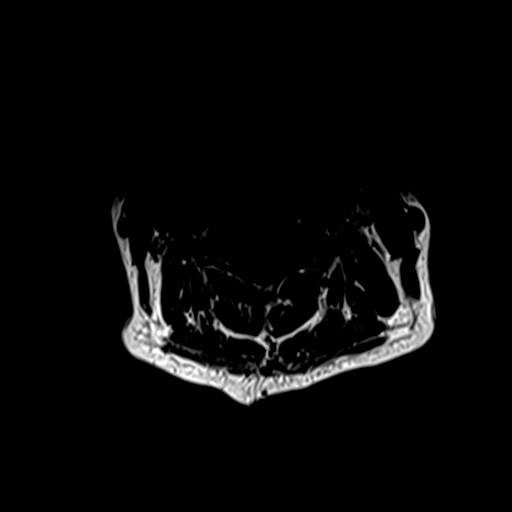
[im 23/33]
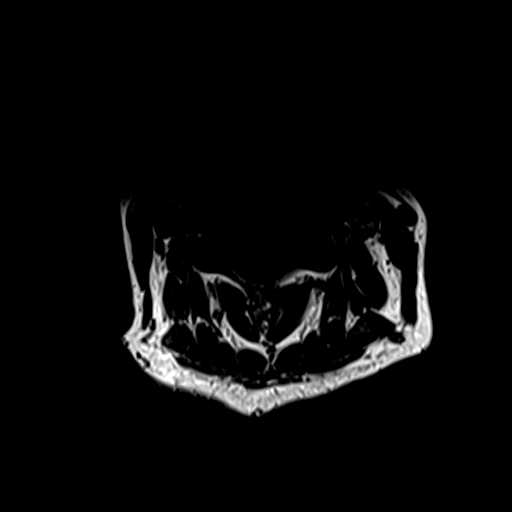
[im 26/33]
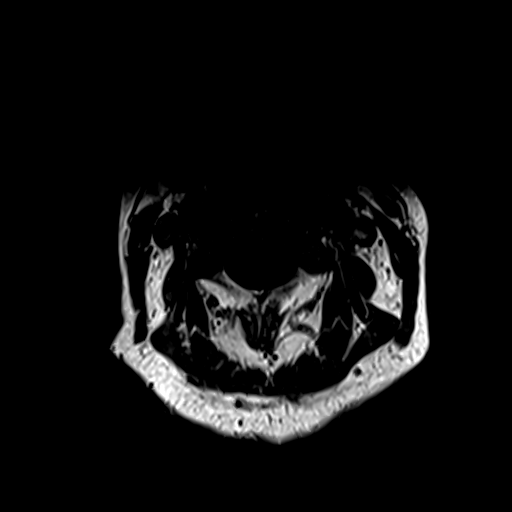
[im 29/33]
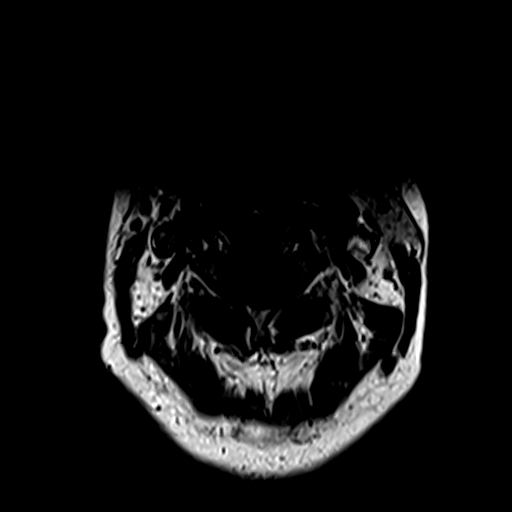
[im 33/33]
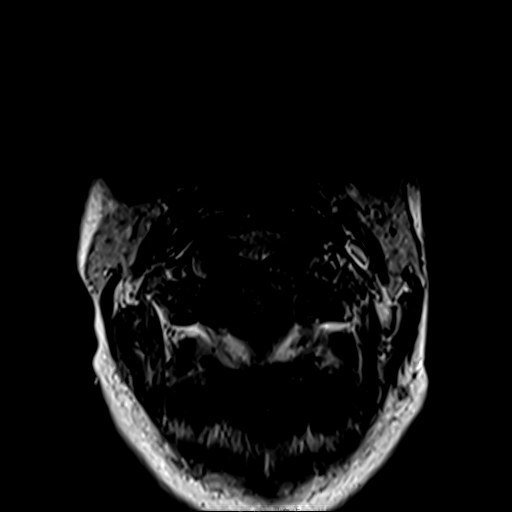

[32 of 48 positions shown; findings below may reference images not displayed]

FINDINGS: Alignment: Unremarkable

Vertebrae: No fracture, evidence of discitis, or bone lesion.

Cord: Normal signal and morphology.

Posterior Fossa, vertebral arteries, paraspinal tissues: 2 small
cysts seen at the level of the pineal gland, the larger measuring 6
mm. Both have internal simple appearance.

Disc levels:

C2-3: Unremarkable.

C3-4: Unremarkable.

C4-5: Right paracentral protrusion with small buttressing osteophyte
extending towards the foramen where there is moderate foraminal
stenosis.

C5-6: Mild annulus bulging.  Negative facets.

C6-7: Unremarkable.

C7-T1:Unremarkable.
IMPRESSION: C4-5 right paracentral to foraminal protrusion with osteophyte that
causes moderate right foraminal stenosis.

Widely patent spinal canal.
FINDINGS: Alignment: Unremarkable

Vertebrae: No fracture, evidence of discitis, or bone lesion.

Cord: Normal signal and morphology.

Posterior Fossa, vertebral arteries, paraspinal tissues: 2 small
cysts seen at the level of the pineal gland, the larger measuring 6
mm. Both have internal simple appearance.

Disc levels:

C2-3: Unremarkable.

C3-4: Unremarkable.

C4-5: Right paracentral protrusion with small buttressing osteophyte
extending towards the foramen where there is moderate foraminal
stenosis.

C5-6: Mild annulus bulging.  Negative facets.

C6-7: Unremarkable.

C7-T1:Unremarkable.
IMPRESSION: C4-5 right paracentral to foraminal protrusion with osteophyte that
causes moderate right foraminal stenosis.

Widely patent spinal canal.

## 2020-10-23 IMAGING — MR MR THORACIC SPINE W/O CM
7 of 8 series · 33 of 48 positions shown · non-contrast
Comparison: None.

CLINICAL DATA: Pain in the thoracic spine

EXAM:
MRI THORACIC SPINE WITHOUT CONTRAST
TECHNIQUE: Multiplanar, multisequence MR imaging of the thoracic spine was
performed. No intravenous contrast was administered.

[Series 26: T1 · sagittal · 4.0mm · 1.76mm/px · 1 of 7 slices shown (1 of 3)]
[im 1/7]
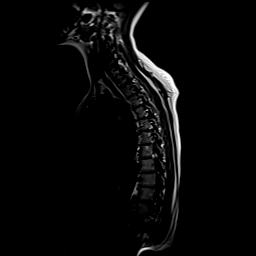

[Series 27: STIR · sagittal · 3.0mm · 1.00mm/px · 4 of 19 slices shown]
[im 1/19]
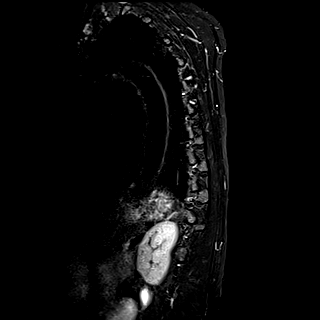
[im 7/19]
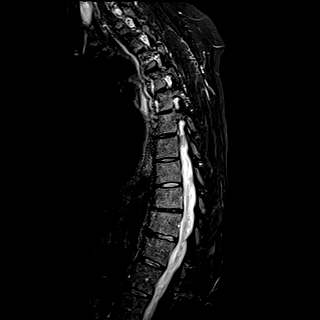
[im 13/19]
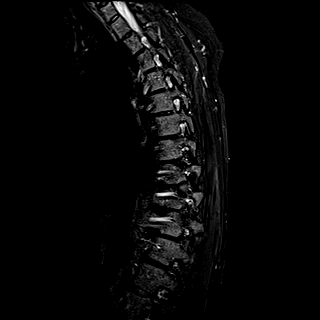
[im 19/19]
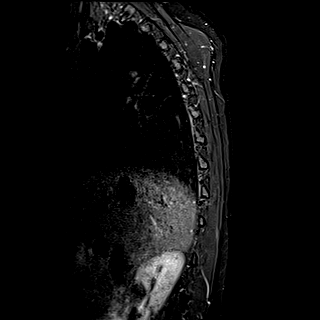

[Series 28: T1 · sagittal · 3.0mm · 1.00mm/px · 5 of 19 slices shown (2 of 3)]
[im 1/19]
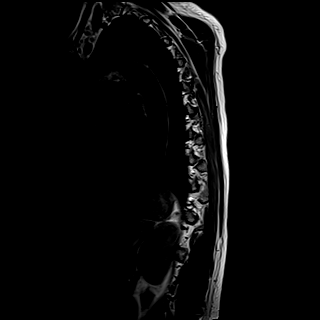
[im 5/19]
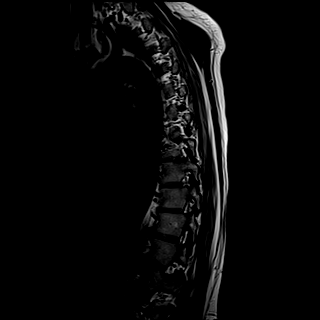
[im 10/19]
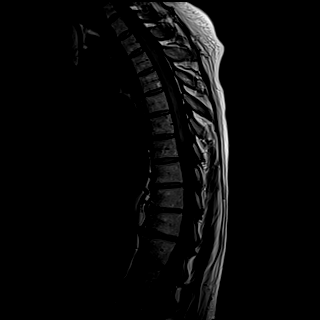
[im 14/19]
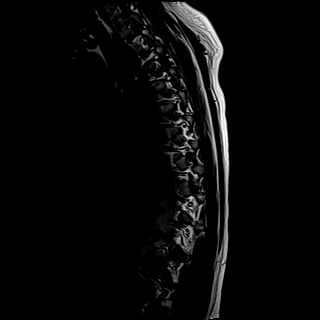
[im 19/19]
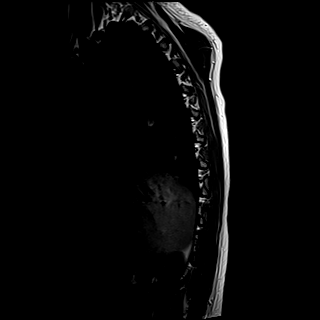

[Series 29: T2 · sagittal · 3.0mm · 0.83mm/px · 5 of 19 slices shown (1 of 2)]
[im 1/19]
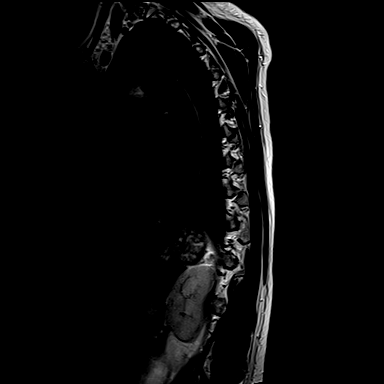
[im 5/19]
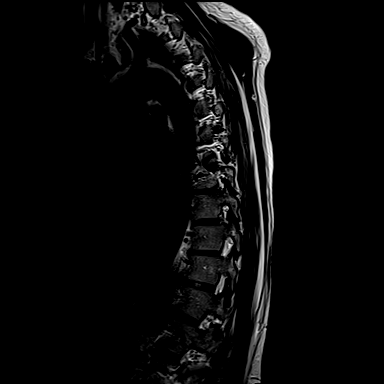
[im 10/19]
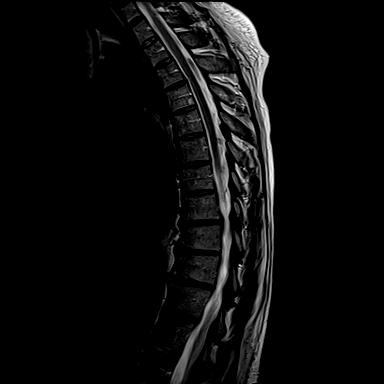
[im 14/19]
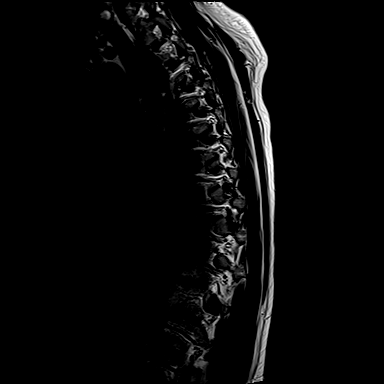
[im 19/19]
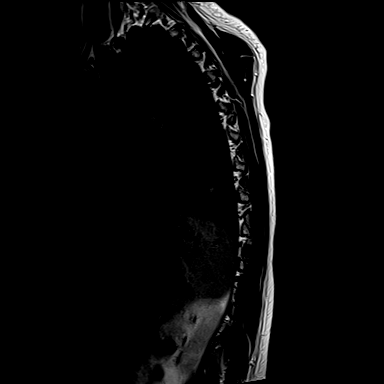

[Series 30: T2 · axial · 4.0mm · 0.78mm/px · z∈[-249,-59]mm · 8 of 39 slices shown (2 of 2)]
[im 1/39]
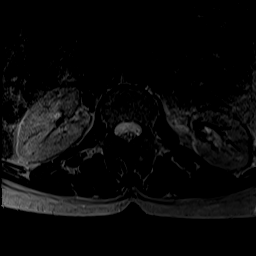
[im 5/39]
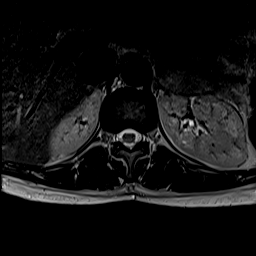
[im 13/39]
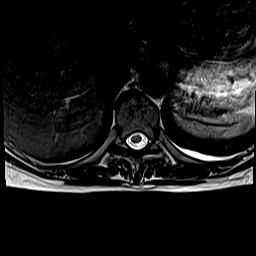
[im 17/39]
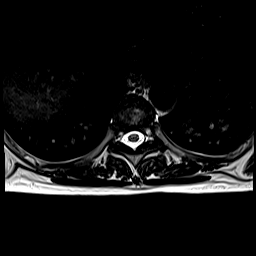
[im 22/39]
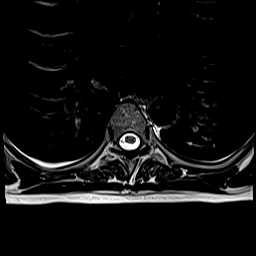
[im 26/39]
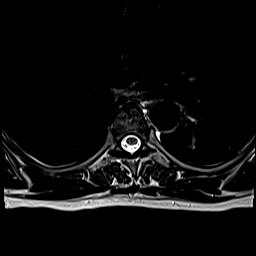
[im 34/39]
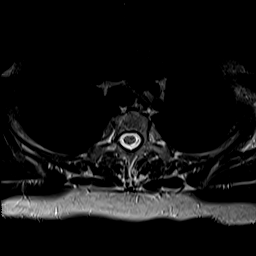
[im 39/39]
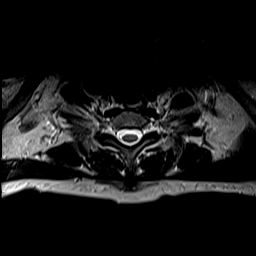

[Series 31: t2_me2d_tra · axial · 4.0mm · 0.39mm/px · z∈[-249,-213]mm · 2 of 39 slices shown]
[im 1/39]
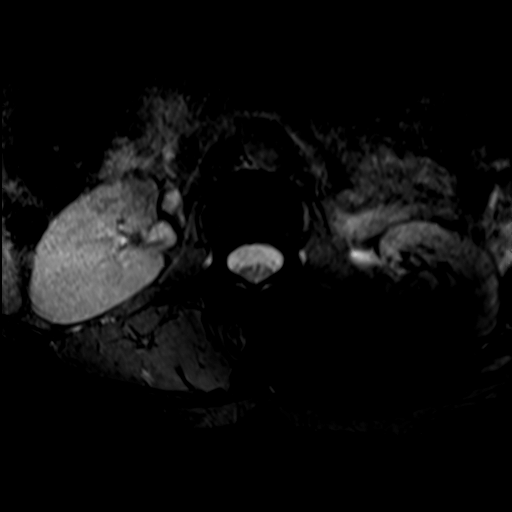
[im 5/39]
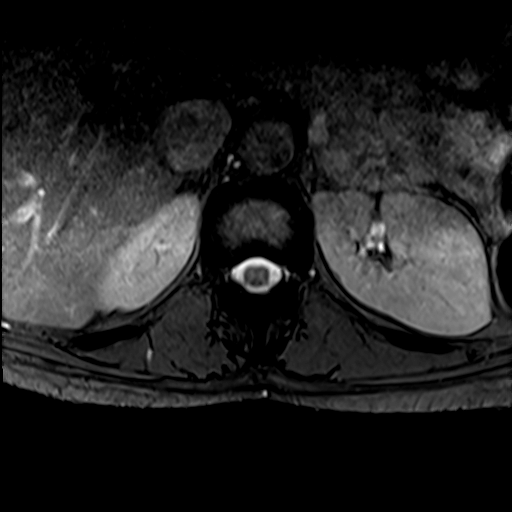

[Series 32: T1 · axial · 4.0mm · 0.39mm/px · z∈[-249,-59]mm · 8 of 39 slices shown (3 of 3)]
[im 1/39]
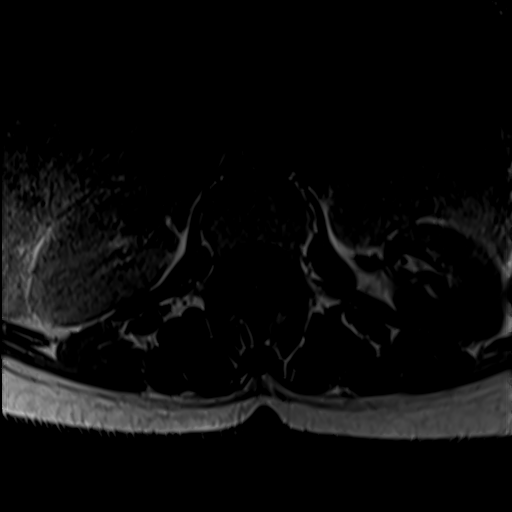
[im 5/39]
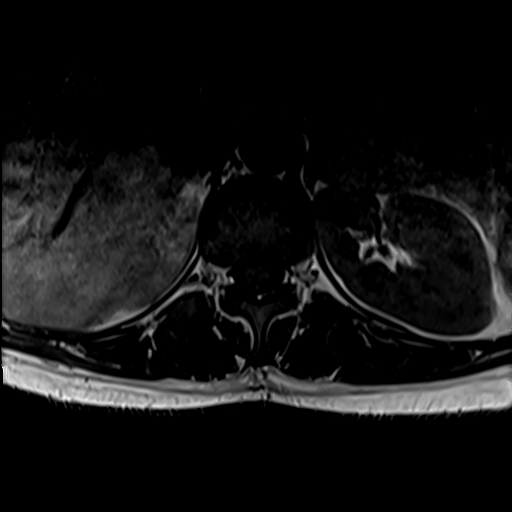
[im 13/39]
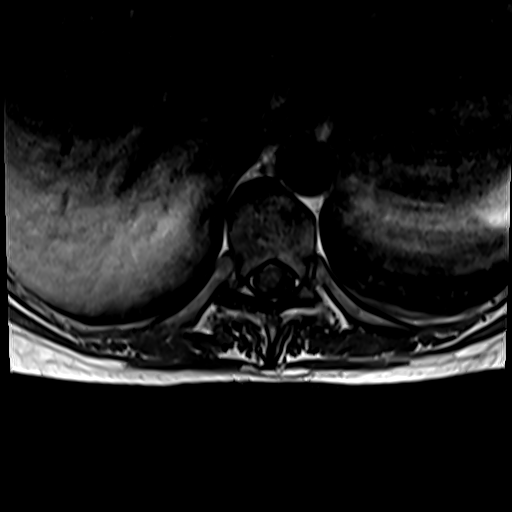
[im 17/39]
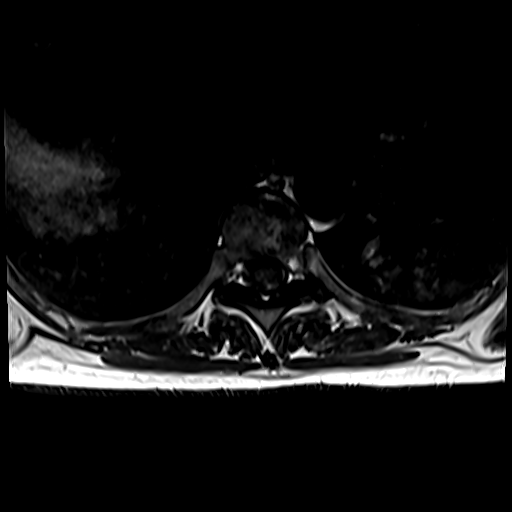
[im 22/39]
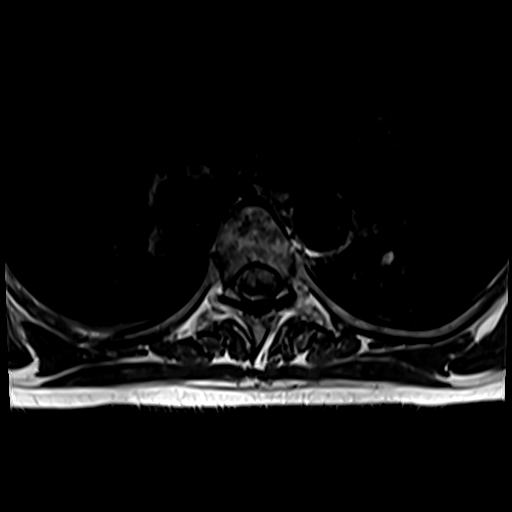
[im 26/39]
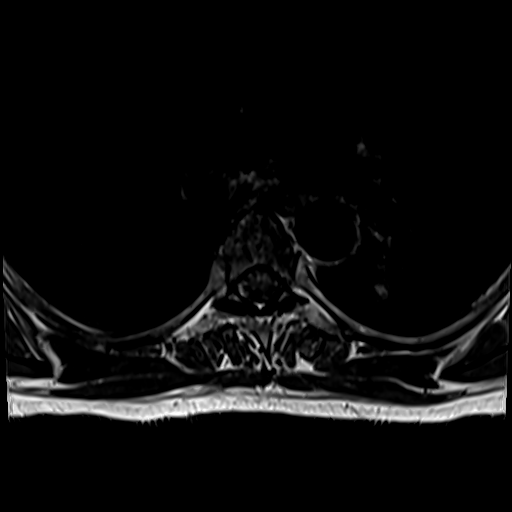
[im 34/39]
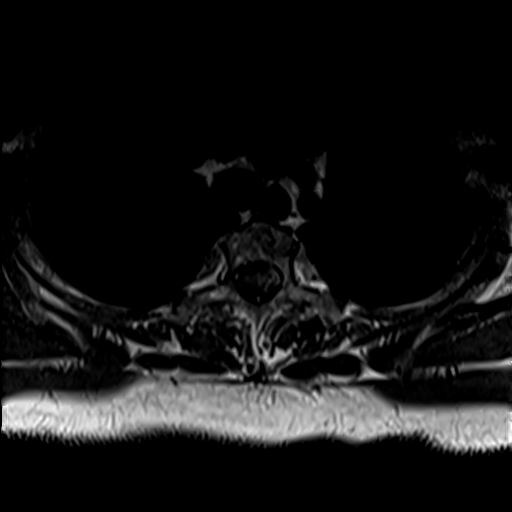
[im 39/39]
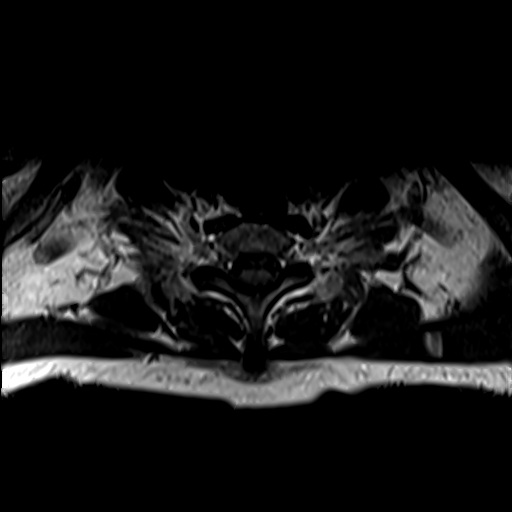

[33 of 48 positions shown; findings below may reference images not displayed]

FINDINGS: Alignment:  Exaggerated thoracic kyphosis.

Vertebrae: No fracture, evidence of discitis, or bone lesion.

Cord:  Normal signal and morphology.

Paraspinal and other soft tissues: Negative.

Disc levels:

Disc space narrowing and desiccation with mild bulging/endplate
ridging at T8-9 to T10-11. Small right paracentral protrusion is
superimposed at T8-9. Mild facet spurring at T7-8 and T8-9. Mild
right foraminal narrowing at T8-9 based on sagittal T1 weighted
imaging. No neural compression.
IMPRESSION: Exaggerated kyphosis with lower thoracic degenerative changes that
are noncompressive.

## 2020-10-25 MED FILL — METHOCARBAMOL 500 MG TABS: 500 | 15 days supply | Qty: 60 | Fill #1

## 2020-10-30 MED FILL — MELOXICAM 15 MG TABLET: 15 | 30 days supply | Qty: 30 | Fill #0

## 2020-10-30 MED FILL — ESCITALOPRAM 20 MG TABLET: 20 | 90 days supply | Qty: 90 | Fill #0

## 2020-11-13 MED FILL — METHOCARBAMOL 500 MG TABS: 500 | 15 days supply | Qty: 60 | Fill #0

## 2020-11-26 IMAGING — MR MR HEAD WO/W CM
9 of 12 series · 35 of 48 positions shown · IV contrast (gadavist)
Comparison: None.

CLINICAL DATA: Left leg and foot numbness

EXAM:
MRI HEAD WITHOUT AND WITH CONTRAST
TECHNIQUE: Multiplanar, multiecho pulse sequences of the brain and surrounding
structures were obtained without and with intravenous contrast.
CONTRAST:  7.5mL GADAVIST GADOBUTROL 1 MMOL/ML IV SOLN

[Series 3: DWI · axial · 3.0mm · 1.09mm/px · z∈[-93,+69]mm · 9 of 110 slices shown (1 of 4)]
[im 1/110]
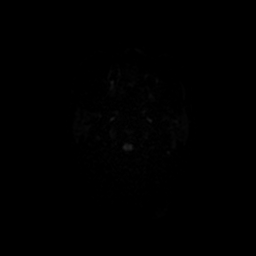
[im 14/110]
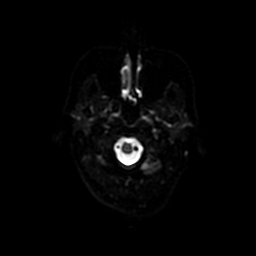
[im 28/110]
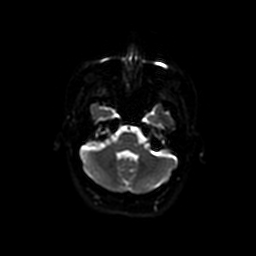
[im 41/110]
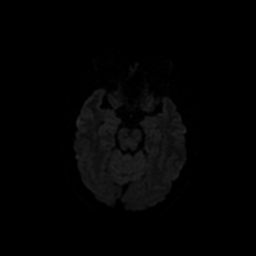
[im 55/110]
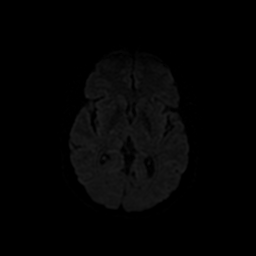
[im 69/110]
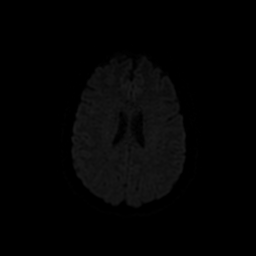
[im 82/110]
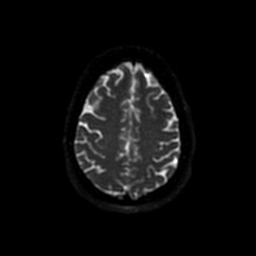
[im 96/110]
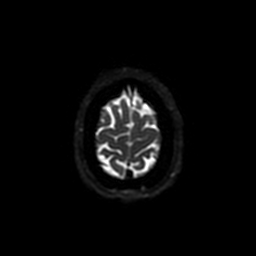
[im 110/110]
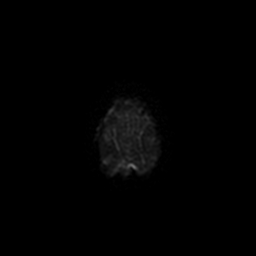

[Series 4: DWI · coronal · 5.0mm · 1.09mm/px · 6 of 78 slices shown (2 of 4)]
[im 1/78]
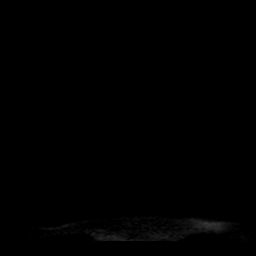
[im 16/78]
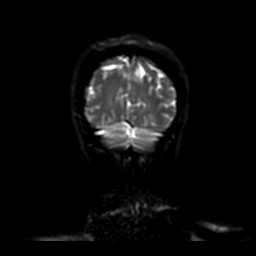
[im 31/78]
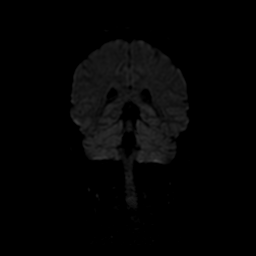
[im 47/78]
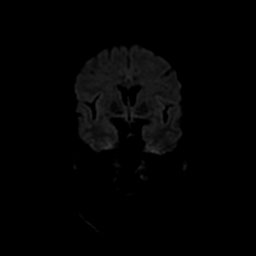
[im 62/78]
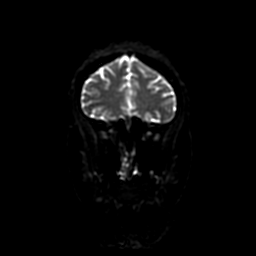
[im 78/78]
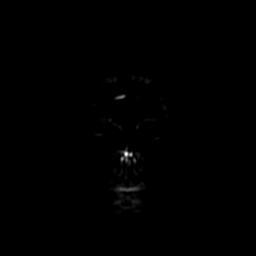

[Series 6: T2 · axial · 5.0mm · 0.43mm/px · z∈[-89,+61]mm · 2 of 26 slices shown]
[im 1/26]
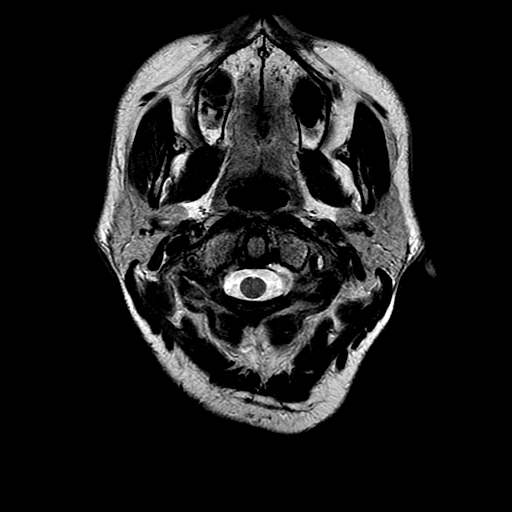
[im 26/26]
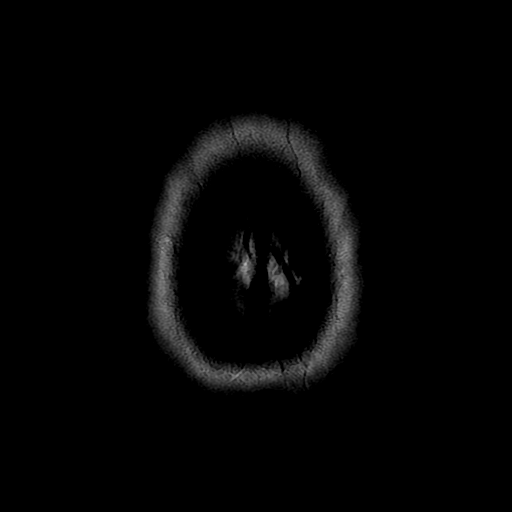

[Series 7: FLAIR · axial · 3.0mm · 0.43mm/px · z∈[-89,+61]mm · 2 of 26 slices shown]
[im 1/26]
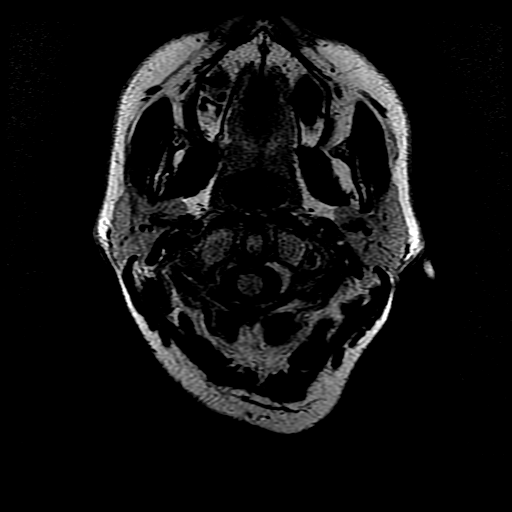
[im 26/26]
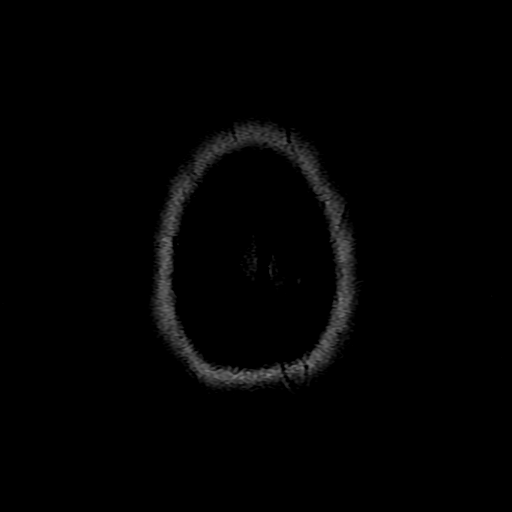

[Series 10: T2 post-contrast · coronal · 5.0mm · 0.39mm/px · 2 of 25 slices shown]
[im 1/25]
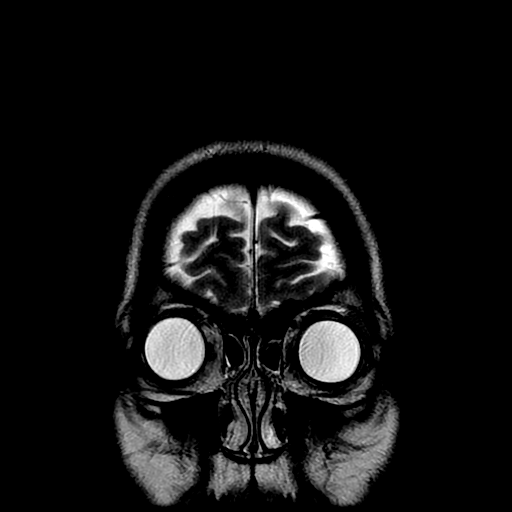
[im 25/25]
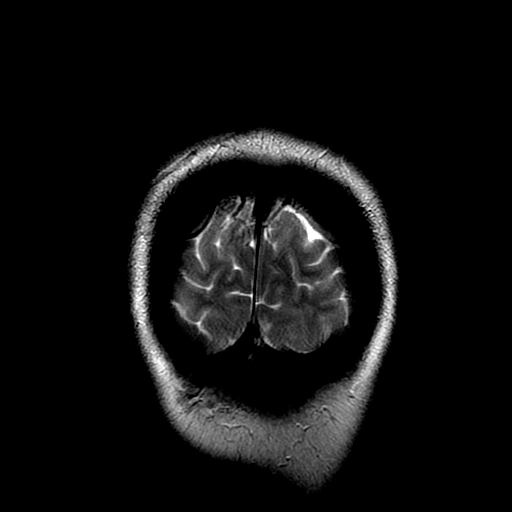

[Series 11: T1 post-contrast · axial · 3.0mm · 0.43mm/px · z∈[-90,+63]mm · 4 of 52 slices shown (1 of 2)]
[im 1/52]
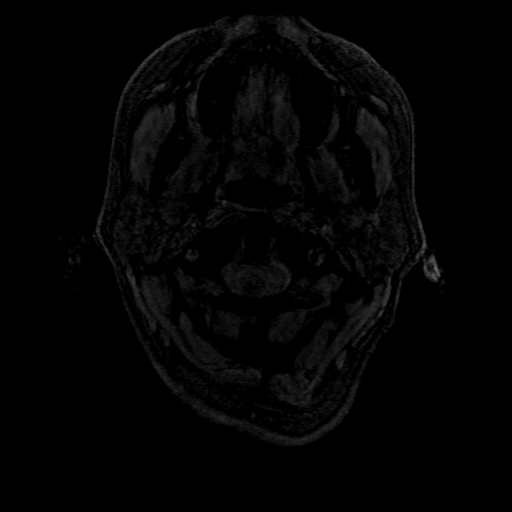
[im 18/52]
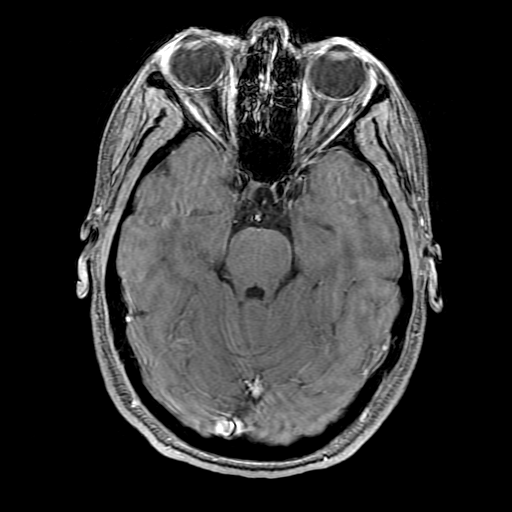
[im 35/52]
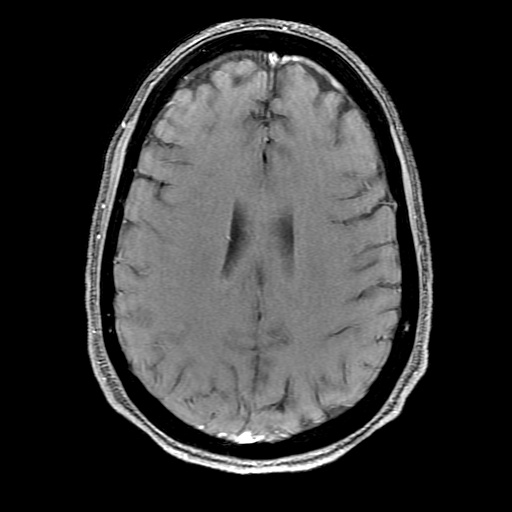
[im 52/52]
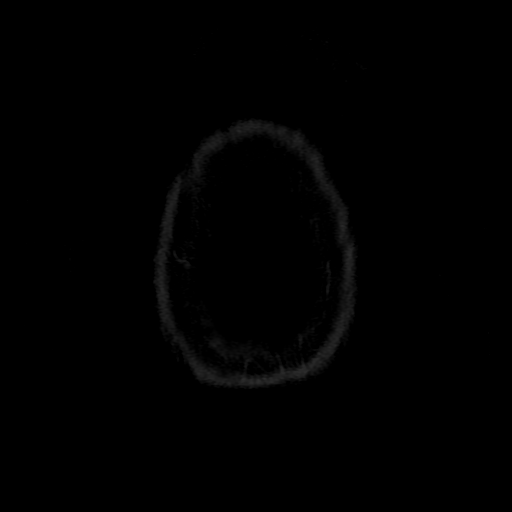

[Series 12: T1 post-contrast · coronal · 5.0mm · 0.39mm/px · 2 of 25 slices shown (2 of 2)]
[im 1/25]
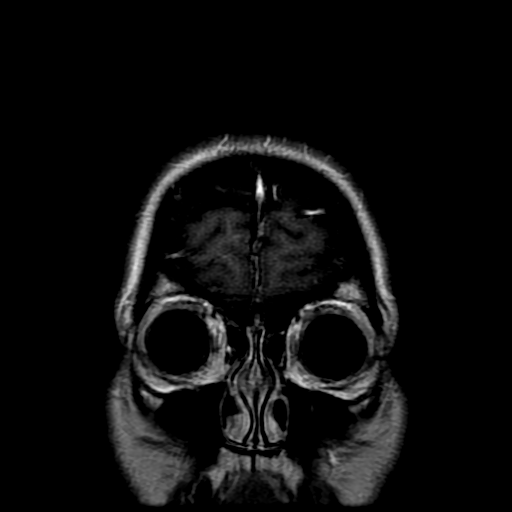
[im 25/25]
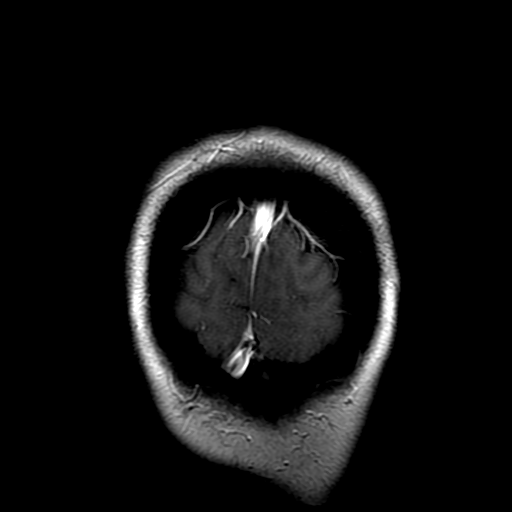

[Series 300: DWI · axial · 3.0mm · 1.09mm/px · z∈[-93,+69]mm · 5 of 55 slices shown (3 of 4)]
[im 1/55]
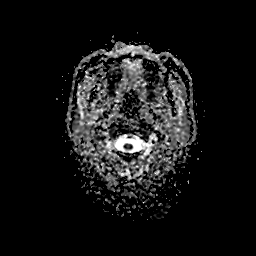
[im 14/55]
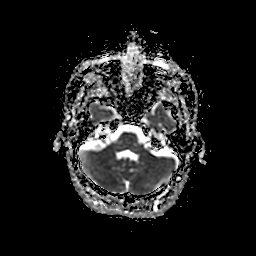
[im 28/55]
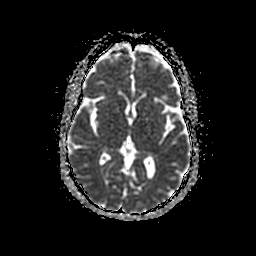
[im 41/55]
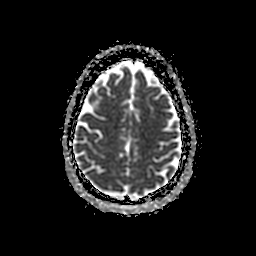
[im 55/55]
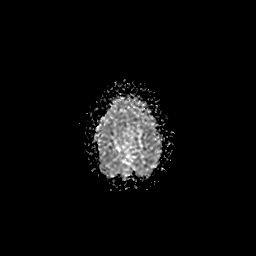

[Series 400: DWI · coronal · 5.0mm · 1.09mm/px · 3 of 36 slices shown (4 of 4)]
[im 1/36]
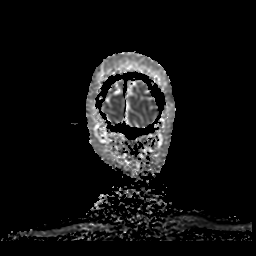
[im 18/36]
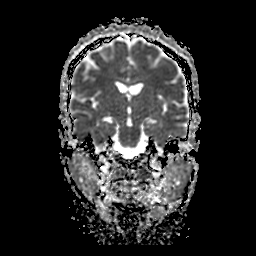
[im 36/36]
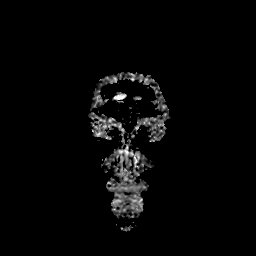

[35 of 48 positions shown; findings below may reference images not displayed]

FINDINGS: Brain: There is no acute infarction or intracranial hemorrhage.
There is no intracranial mass, mass effect, or edema. There is no
hydrocephalus or extra-axial fluid collection. Ventricles and sulci
are normal in size and configuration. Minimal punctate foci of T2
hyperintensity in the supratentorial white matter. No abnormal
enhancement.

Vascular: Major vessel flow voids at the skull base are preserved.

Skull and upper cervical spine: Normal marrow signal is preserved.

Sinuses/Orbits: Minor mucosal thickening.  Orbits are unremarkable.

Other: Sella is unremarkable.  Minor mastoid fluid opacification.
IMPRESSION: No evidence of recent infarction, hemorrhage, or mass. No abnormal
enhancement.

Minimal punctate T2 hyperintense foci in the cerebral white matter
likely reflecting nonspecific gliosis/demyelination of doubtful
clinical significance.

## 2020-11-26 IMAGING — MR MR LUMBAR SPINE W/O CM
4 of 5 series · 19 of 48 positions shown · non-contrast
Comparison: CT [DATE]

CLINICAL DATA: Left leg and foot numbness.  Difficulty ambulating.

EXAM:
MRI LUMBAR SPINE WITHOUT CONTRAST
TECHNIQUE: Multiplanar, multisequence MR imaging of the lumbar spine was
performed. No intravenous contrast was administered.

[Series 3: T2 · sagittal · 4.0mm · 0.55mm/px · 6 of 12 slices shown (1 of 2)]
[im 1/12]
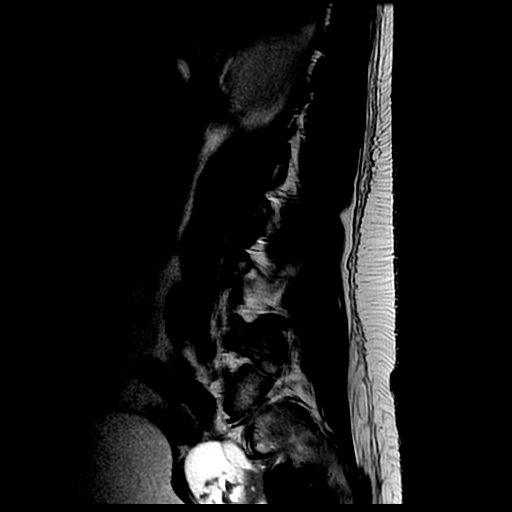
[im 3/12]
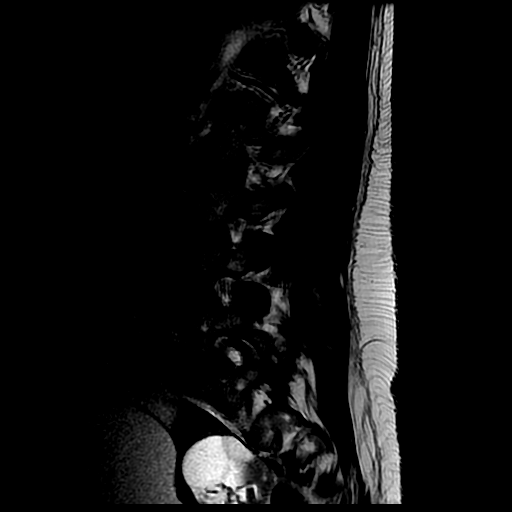
[im 5/12]
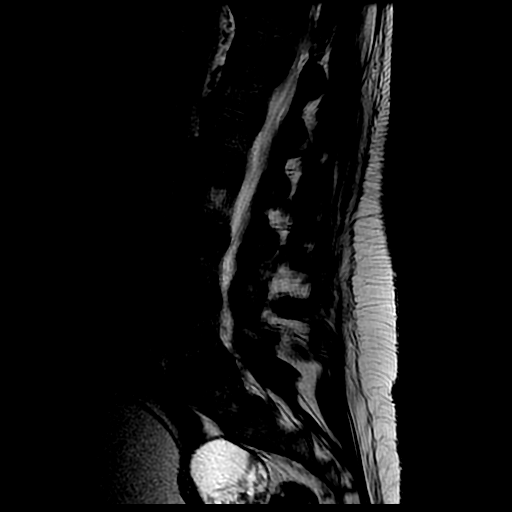
[im 7/12]
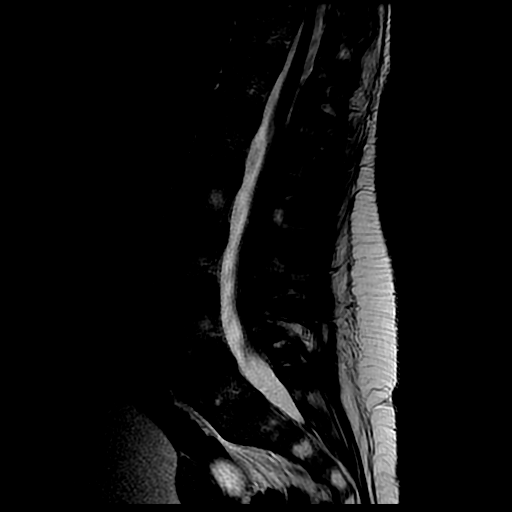
[im 9/12]
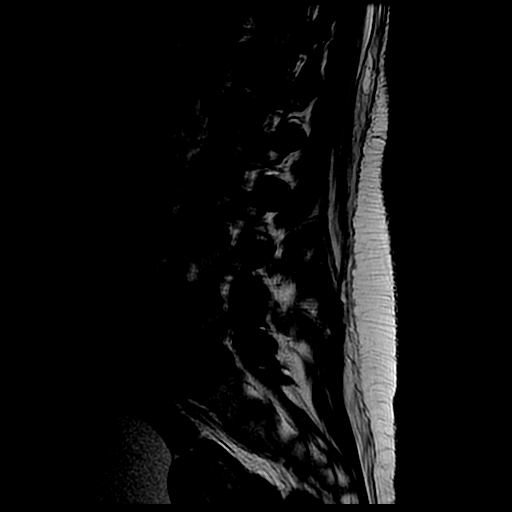
[im 12/12]
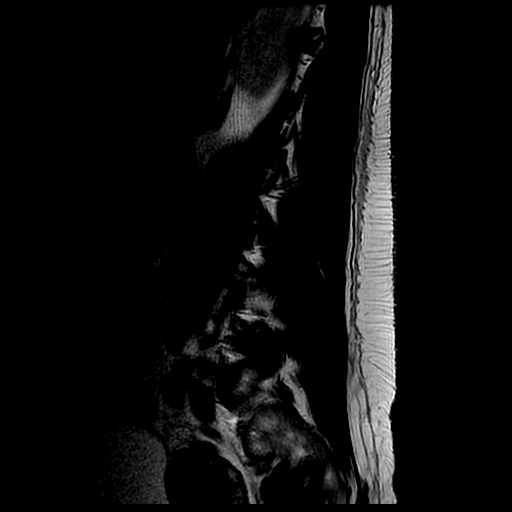

[Series 4: T1 · sagittal · 4.0mm · 0.55mm/px · 3 of 12 slices shown (1 of 2)]
[im 1/12]
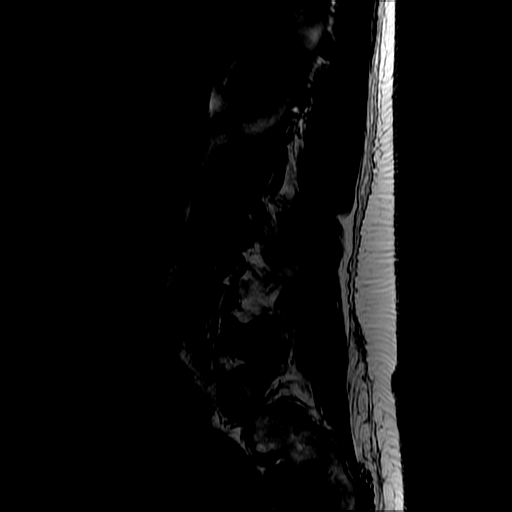
[im 6/12]
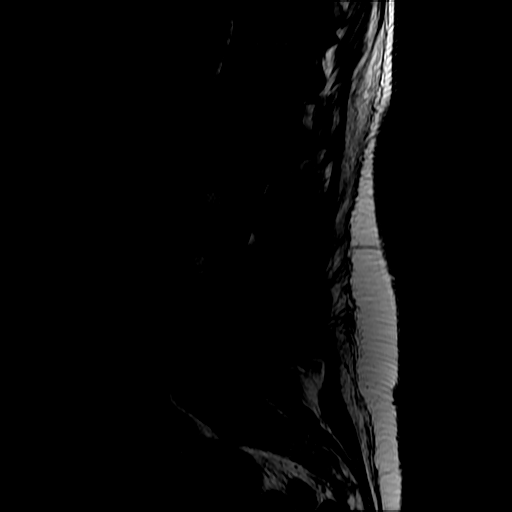
[im 12/12]
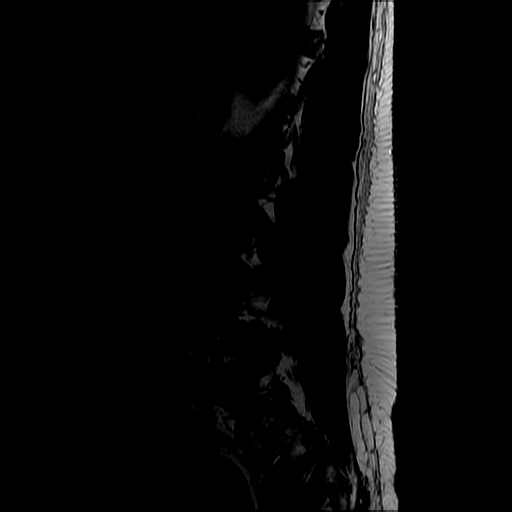

[Series 6: T2 · axial · 4.0mm · 0.39mm/px · z∈[-90,+93]mm · 7 of 36 slices shown (2 of 2)]
[im 3/36]
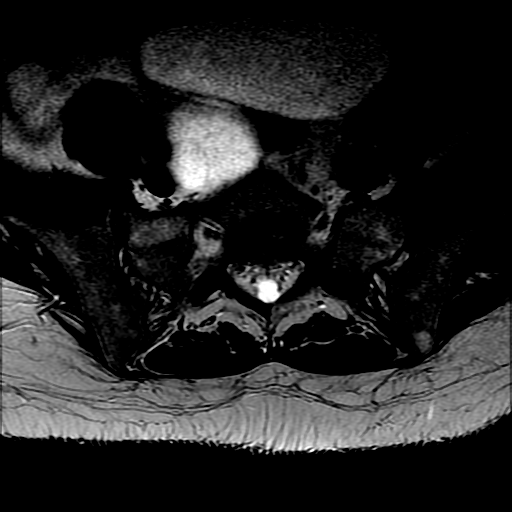
[im 5/36]
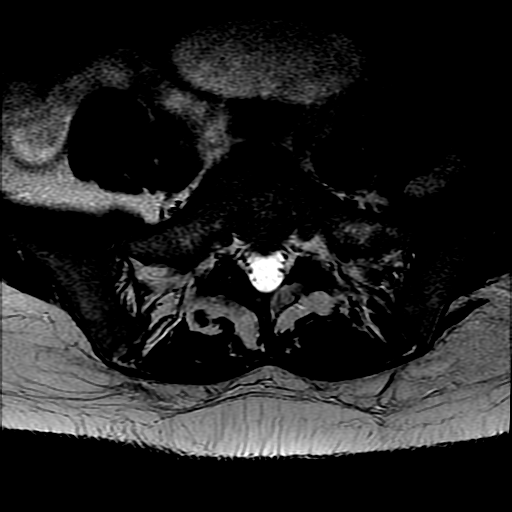
[im 8/36]
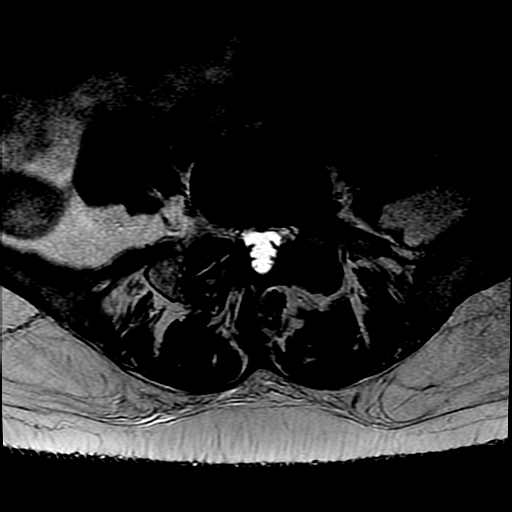
[im 12/36]
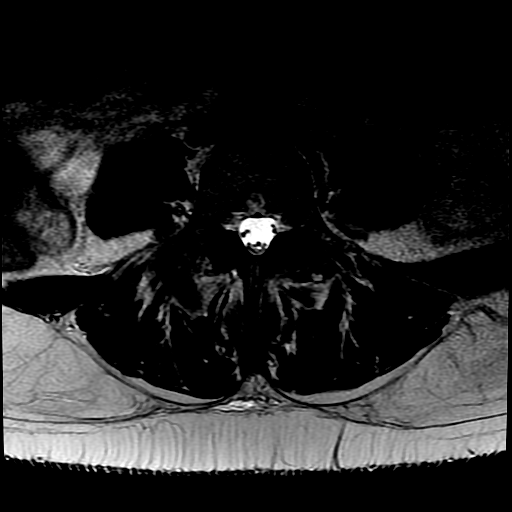
[im 17/36]
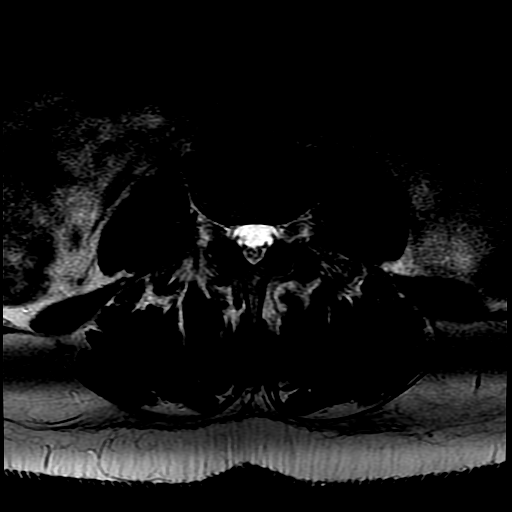
[im 19/36]
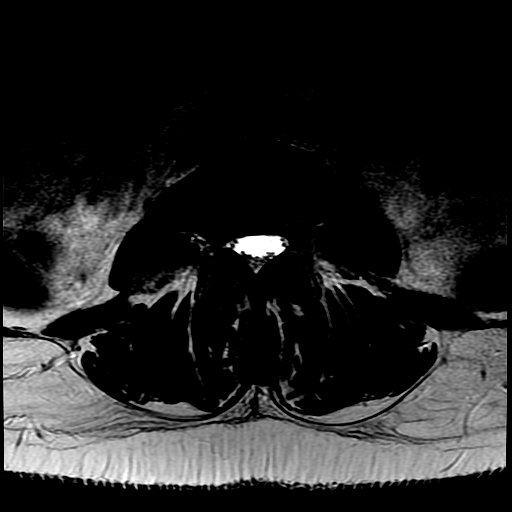
[im 31/36]
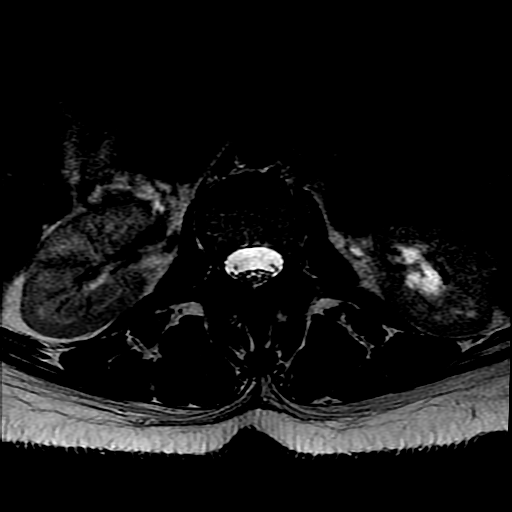

[Series 7: T1 · axial · 4.0mm · 0.39mm/px · z∈[-80,+93]mm · 3 of 36 slices shown (2 of 2)]
[im 5/36]
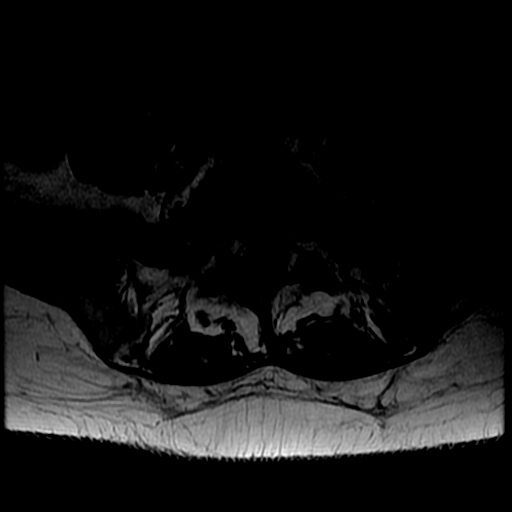
[im 19/36]
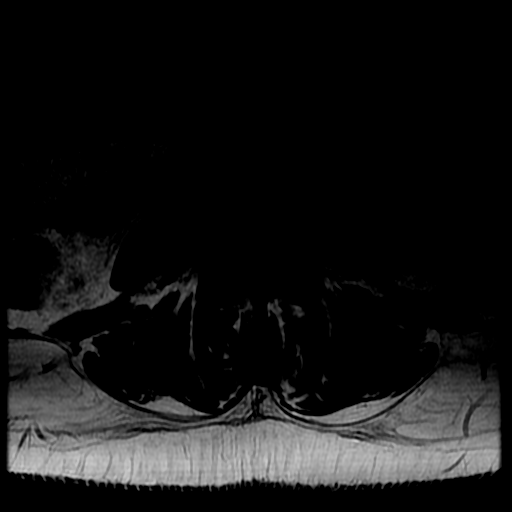
[im 31/36]
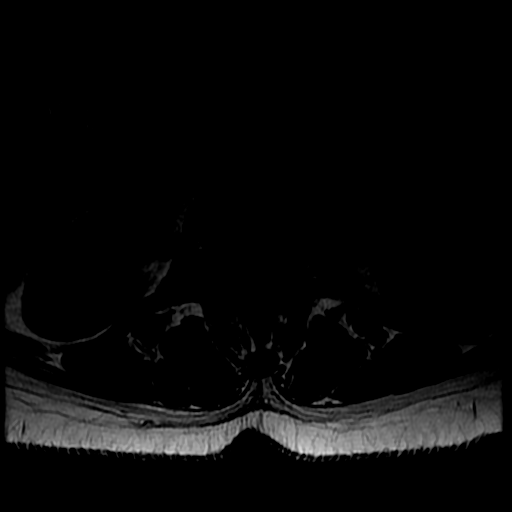

[19 of 48 positions shown; findings below may reference images not displayed]

FINDINGS: Segmentation: Transitional lumbosacral anatomy with partial
sacralization of the L5 segment. Incompletely formed disc space at
L5-S1. Hypoplastic T12 ribs.

Alignment:  Physiologic.

Vertebrae:  No fracture, evidence of discitis, or bone lesion.

Conus medullaris and cauda equina: Conus extends to the L1 level.
Conus and cauda equina appear normal.

Paraspinal and other soft tissues: Incompletely visualized complex
right adnexal cyst measuring approximately 5.2 x 4.7 cm with
internal septations and layering fluid-fluid levels.

Disc levels:

T12-L1: Negative.

L1-L2: Negative.

L2-L3: Minimal broad-based disc bulge. No foraminal or canal
stenosis.

L3-L4: Negative.

L4-L5: Minimal broad-based disc bulge. Advanced bilateral facet
arthropathy. Mild bilateral foraminal stenosis without canal
stenosis.

L5-S1: Transitional.  No impingement.
IMPRESSION: 1. Transitional lumbosacral anatomy with partial sacralization of
the L5 segment.
2. Advanced bilateral facet arthropathy at L4-L5 resulting in mild
bilateral foraminal stenosis without canal stenosis.
3. Incompletely visualized complex right adnexal cystic lesion
measuring approximately 5.2 x 4.7 cm with internal septations and
layering fluid-fluid levels. As this lesion is not adequately
characterized, prompt US is recommended for further evaluation.
Note: This recommendation does not apply to premenarchal patients
and to those with increased risk (genetic, family history, elevated
tumor markers or other high-risk factors) of ovarian cancer.
Reference: JACR [DATE]):[id number]

## 2020-11-27 MED FILL — MELOXICAM 15 MG TABLET: 15 | 30 days supply | Qty: 30 | Fill #1

## 2020-12-05 MED FILL — CYCLOBENZAPRINE HCL 10 MG T: 10 | 14 days supply | Qty: 40 | Fill #1

## 2020-12-05 MED FILL — METHOCARBAMOL 500 MG TABS: 500 | 15 days supply | Qty: 60 | Fill #1

## 2020-12-27 IMAGING — MR MR BREAST BILAT WO/W CM
8 of 12 series · 31 of 48 positions shown · IV contrast (7 ml gadavist)
Comparison: Previous exam(s).

CLINICAL DATA: Family history of breast cancer including a mother
at age 43. The patient had an MRI [DATE] which
demonstrated 1 biopsy target on the right and 2 potential targets on
the left. The right-sided biopsy occurred under second-look
ultrasound representing a fibroadenoma. Only 1 of the 2 left-sided
sites was seen at the time of biopsy. This biopsy demonstrated
normal breast tissue was considered concordant. This is a six-month
follow-up.

LABS:  None
EXAM:
BILATERAL BREAST MRI WITH AND WITHOUT CONTRAST
TECHNIQUE: Multiplanar, multisequence MR images of both breasts were obtained
prior to and following the intravenous administration of 7 ml of
Gadavist

[Series 2: t2_tirm_tra ipat (a-p) · axial · 3.0mm · 0.70mm/px · 1 of 55 slices shown]
[im 1/55]
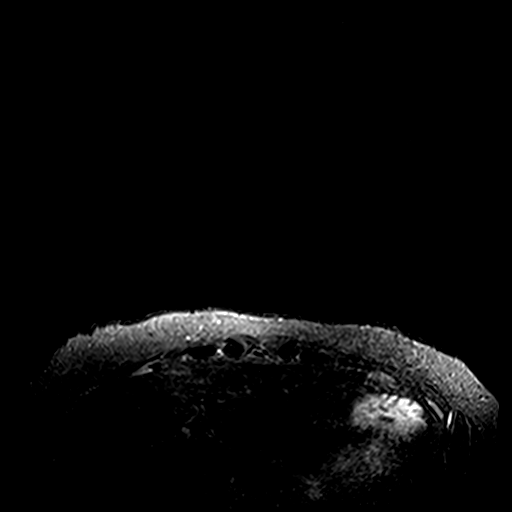

[Series 3: fl3d pre-cm no · axial · non-contrast · 1.2mm · 0.94mm/px · z∈[-112,+59]mm · 5 of 144 slices shown]
[im 1/144]
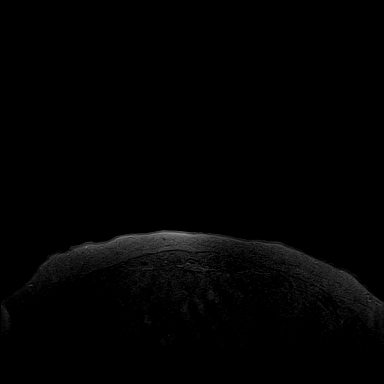
[im 36/144]
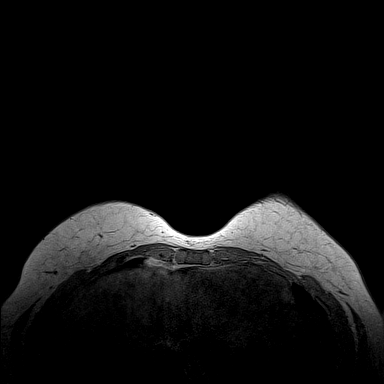
[im 72/144]
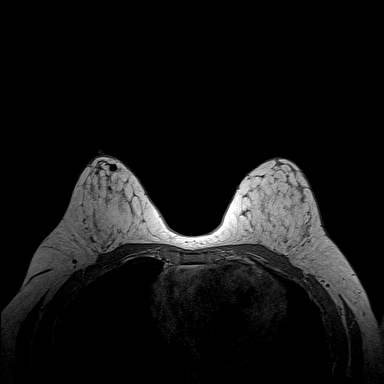
[im 108/144]
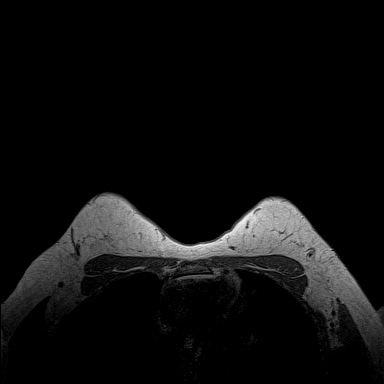
[im 144/144]
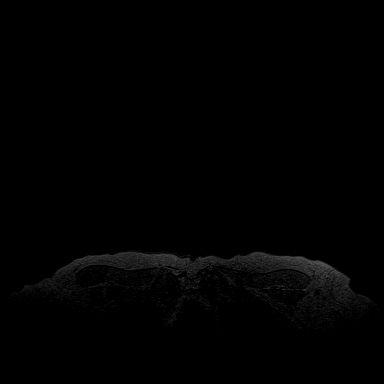

[Series 4: fl3d pre-cm · axial · non-contrast · 1.2mm · 0.94mm/px · z∈[-112,+59]mm · 5 of 144 slices shown]
[im 1/144]
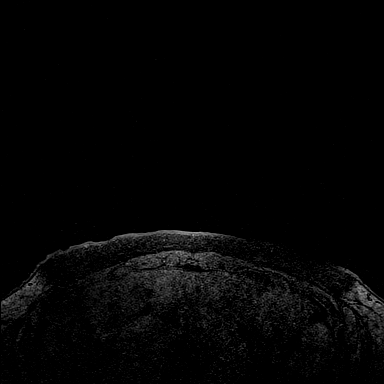
[im 36/144]
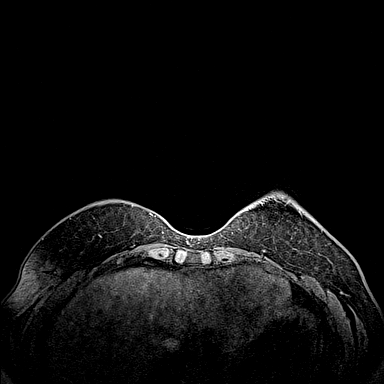
[im 72/144]
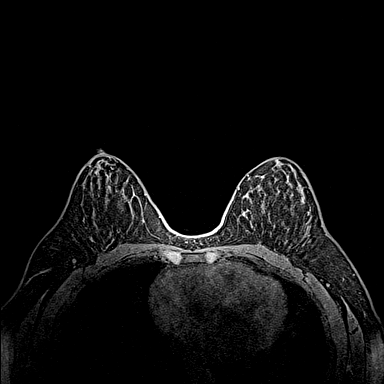
[im 108/144]
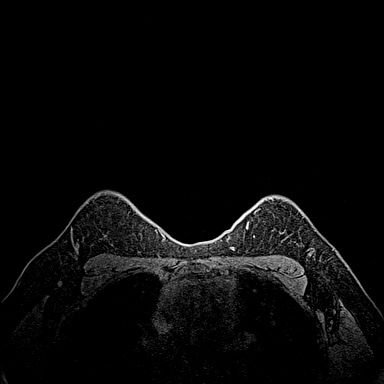
[im 144/144]
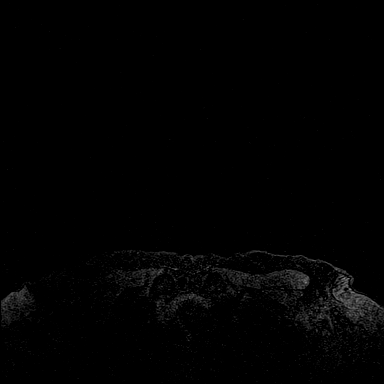

[Series 5: fl3d post-cm 20 · axial · 1.2mm · 0.94mm/px · z∈[-112,+59]mm · 5 of 144 slices shown (1 of 3)]
[im 1/144]
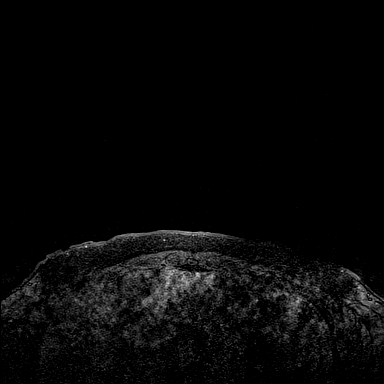
[im 36/144]
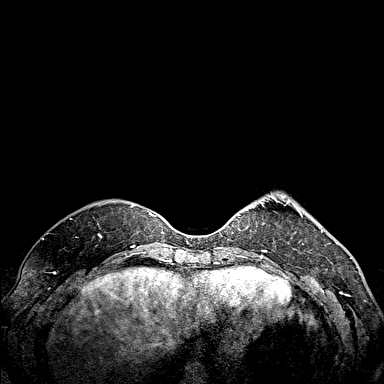
[im 72/144]
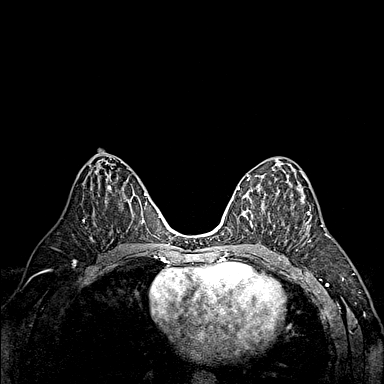
[im 108/144]
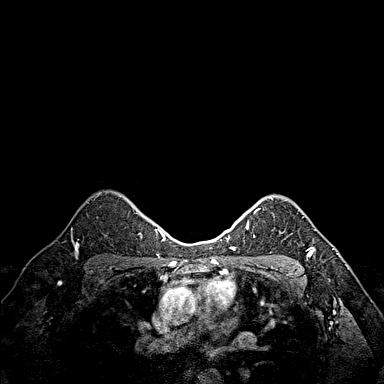
[im 144/144]
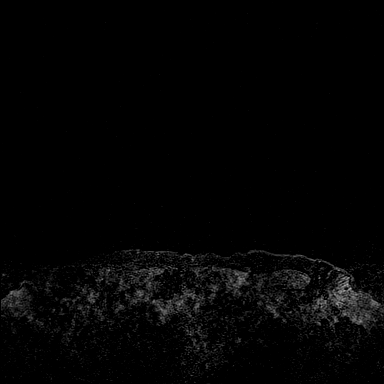

[Series 6: fl3d post-cm 20 · axial · 1.2mm · 0.94mm/px · z∈[-112,+59]mm · 5 of 144 slices shown (2 of 3)]
[im 1/144]
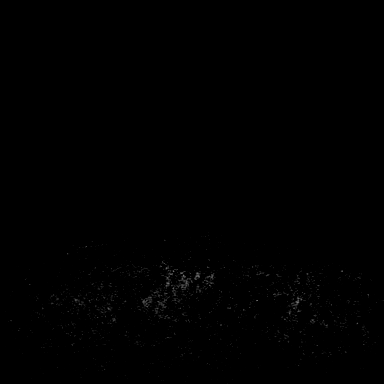
[im 36/144]
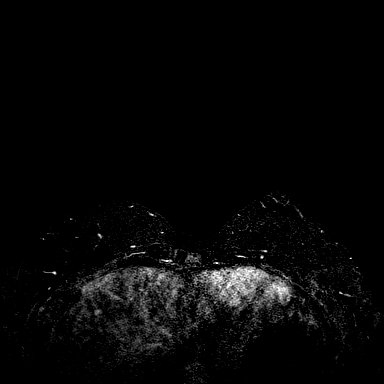
[im 72/144]
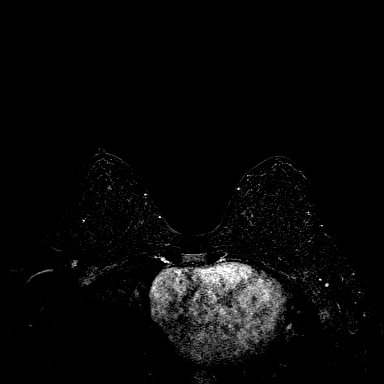
[im 108/144]
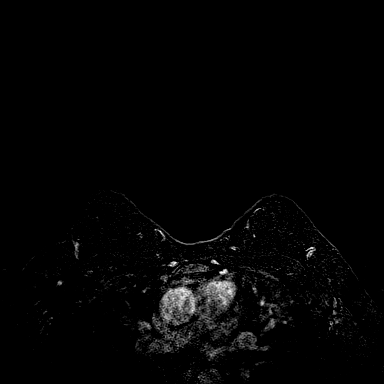
[im 144/144]
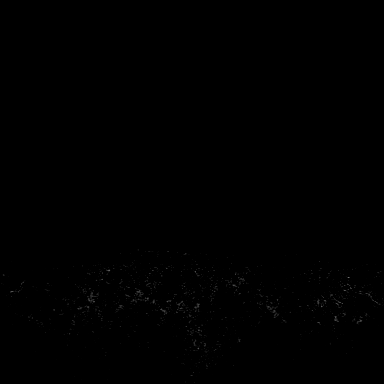

[Series 7: fl3d post-cm 20 · axial · 172.8mm · 0.94mm/px · 1 of 1 slices shown (3 of 3)]
[im 1/1]
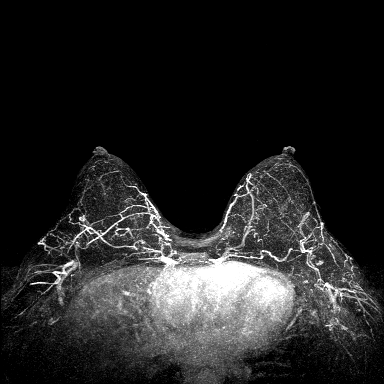

[Series 8: fl3d post-cm 3min · axial · 1.2mm · 0.94mm/px · z∈[-112,+59]mm · 6 of 144 slices shown]
[im 1/144]
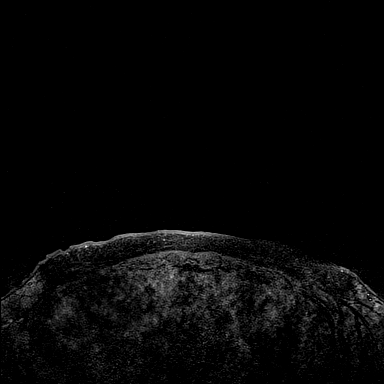
[im 29/144]
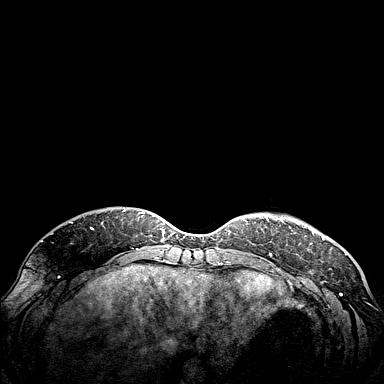
[im 58/144]
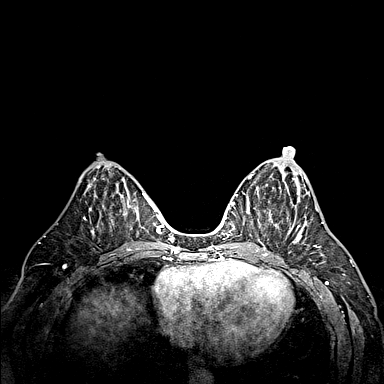
[im 86/144]
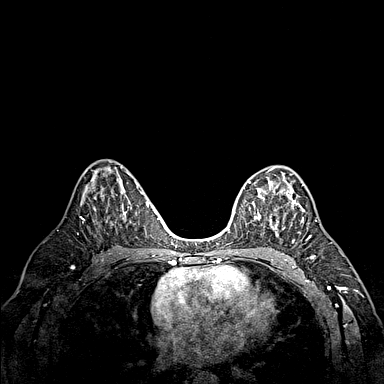
[im 115/144]
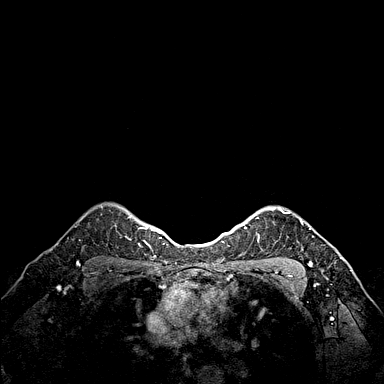
[im 144/144]
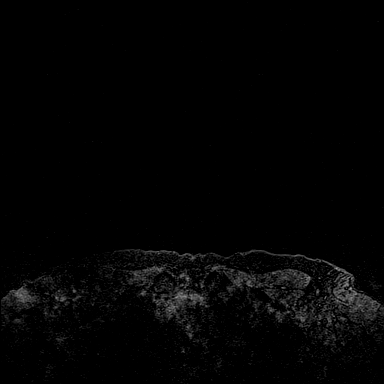

[Series 9: fl3d post-cm 3min_sub · axial · 1.2mm · 0.94mm/px · z∈[-112,-44]mm · 3 of 144 slices shown]
[im 1/144]
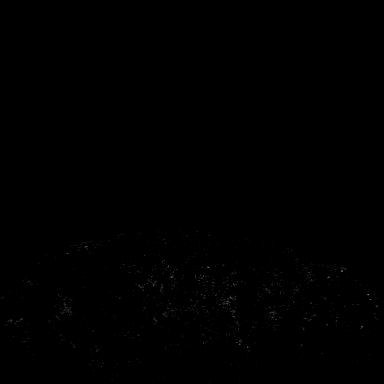
[im 29/144]
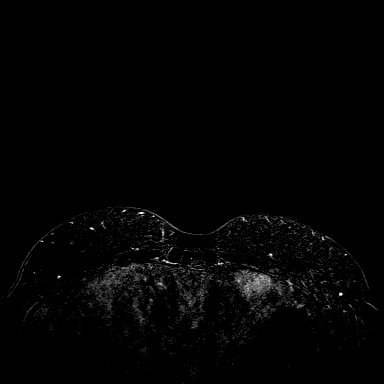
[im 58/144]
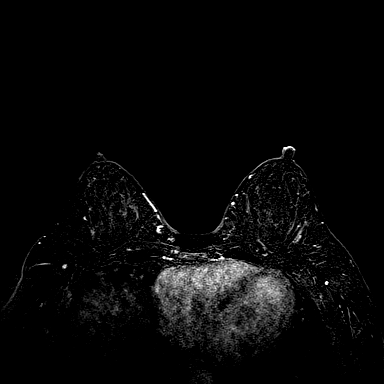

[31 of 48 positions shown; findings below may reference images not displayed]

Three-dimensional MR images were rendered by post-processing of the
original MR data on an independent workstation. The
three-dimensional MR images were interpreted, and findings are
reported in the following complete MRI report for this study. Three
dimensional images were evaluated at the independent interpreting
workstation using the DynaCAD thin client.
FINDINGS: Breast composition: b. Scattered fibroglandular tissue.

Background parenchymal enhancement: Moderate.

Right breast: The previously biopsied fibroadenoma does not enhance
significantly on today's study. The biopsy clip is within the MRI
finding from [DATE]. No other suspicious findings seen in the
right breast.

Left breast: The biopsy clip is within the site of enhancement in
the upper outer left breast seen in [DATE]. There has been no
significant MRI change in this region to suggest malignancy. The
previously identified enhancement in the lower outer quadrant of the
left breast is again visualized on 3 and 5 minute delayed imaging.
There is minimal enhancement in this region on 20 second delayed
imaging. This is consistent with a benign process without
significant interval change. No other suspicious findings on the
left.

Lymph nodes: No abnormal appearing lymph nodes.

Ancillary findings:  None.
IMPRESSION: No MRI evidence of malignancy today. The biopsy clips previously
placed are within the right-sided fibroadenoma correlating with the
MRI finding and within the upper outer enhancement with no change in
this region. The enhancement in the lower outer left breast which
could not be biopsied is visualized today with very low-grade
imaging on initial postcontrast imaging and persistent enhancement
on delayed imaging, suggesting a benign process.

RECOMMENDATION:
Recommend another six-month follow-up breast MRI to follow the
enhancement in the lower outer left breast which is favored to be
benign based on appearance. If the region remains stable at that
time, recommend returning to annual breast MRI. Recommend continued
annual mammography.

BI-RADS CATEGORY  3: Probably benign.
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
## 2021-05-29 IMAGING — MR MR BREAST BILAT WO/W CM
8 of 13 series · 32 of 48 positions shown · IV contrast (9 ml gadavist)
Comparison: Screening mammogram on [DATE], MRI on [DATE],
[DATE], and [DATE]

CLINICAL DATA: Family history of breast cancer diagnosed in the
patient's mother at age 45. Previous benign biopsy of the LEFT and
RIGHT breast in [1B]. on MRI performed in [DATE], there was
focal non mass enhancement in the LOWER LEFT breast at posterior
depth. When patient returned for biopsy, this region was not well
seen and followup was recommended.

LABS:  None obtained at the time of imaging.
EXAM:
BILATERAL BREAST MRI WITH AND WITHOUT CONTRAST
TECHNIQUE: Multiplanar, multisequence MR images of both breasts were obtained
prior to and following the intravenous administration of 9 ml of
Gadavist

[Series 2: t2_tirm_tra ipat (a-p) · axial · 3.0mm · 0.70mm/px · 1 of 55 slices shown]
[im 1/55]
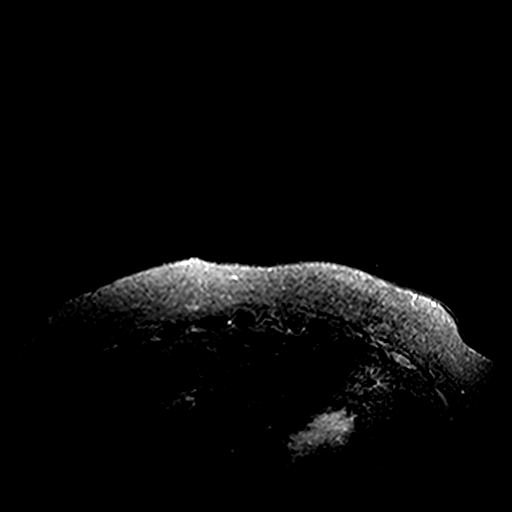

[Series 3: fl3d pre-cm no · axial · non-contrast · 1.2mm · 0.94mm/px · z∈[-67,+105]mm · 5 of 144 slices shown]
[im 1/144]
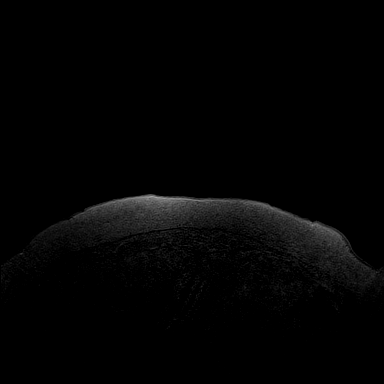
[im 36/144]
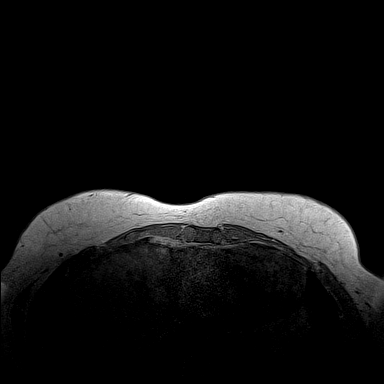
[im 72/144]
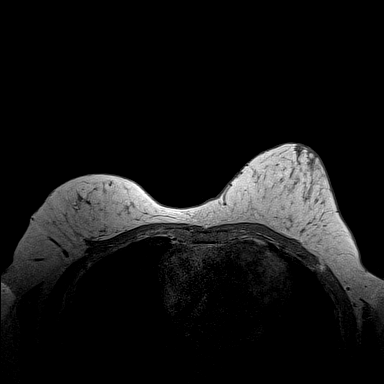
[im 108/144]
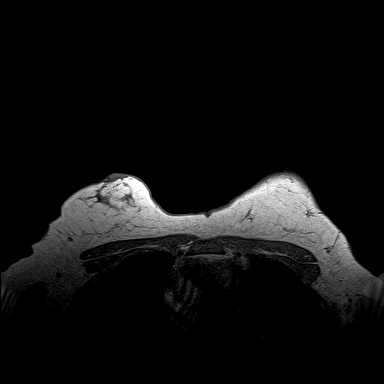
[im 144/144]
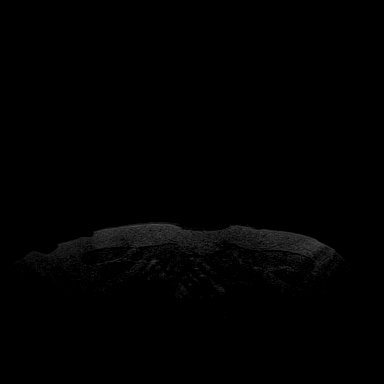

[Series 4: fl3d pre-cm · axial · non-contrast · 1.2mm · 0.94mm/px · z∈[-67,+105]mm · 5 of 144 slices shown]
[im 1/144]
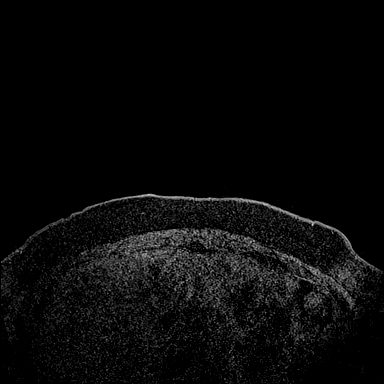
[im 36/144]
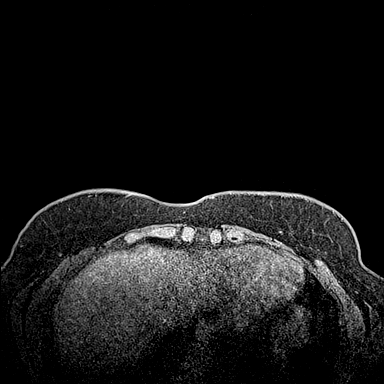
[im 72/144]
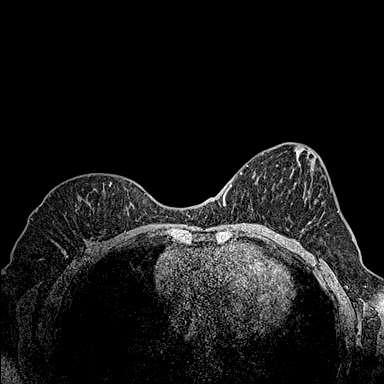
[im 108/144]
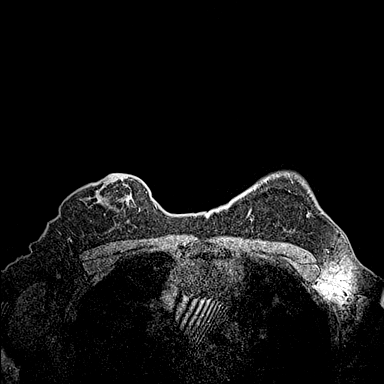
[im 144/144]
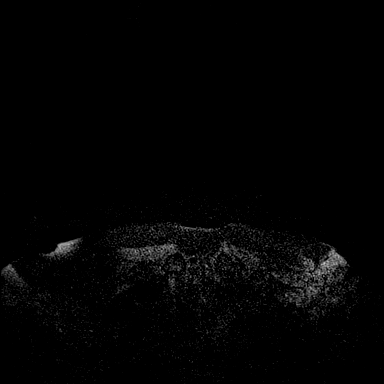

[Series 5: fl3d post-cm 20 · axial · 1.2mm · 0.94mm/px · z∈[-67,+105]mm · 5 of 144 slices shown (1 of 3)]
[im 1/144]
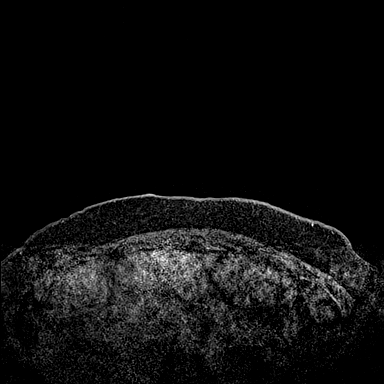
[im 36/144]
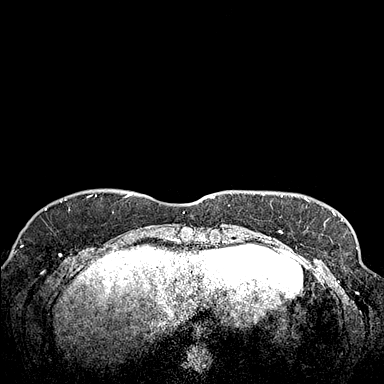
[im 72/144]
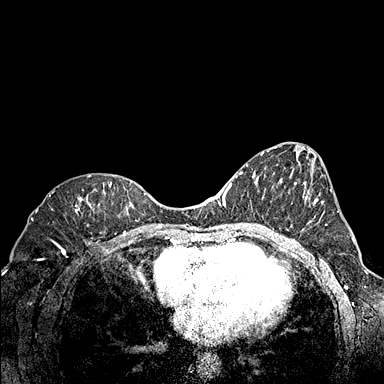
[im 108/144]
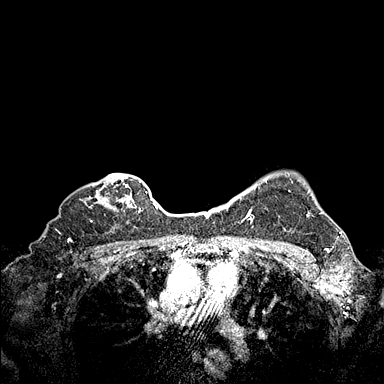
[im 144/144]
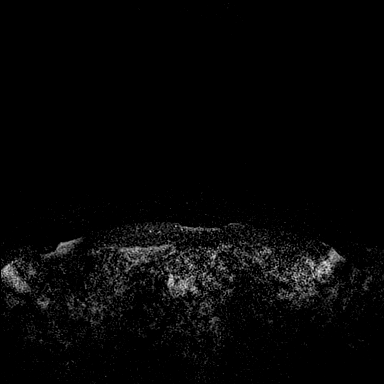

[Series 6: fl3d post-cm 20 · axial · 1.2mm · 0.94mm/px · z∈[-67,+105]mm · 5 of 144 slices shown (2 of 3)]
[im 1/144]
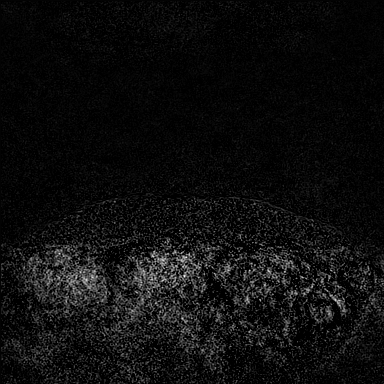
[im 36/144]
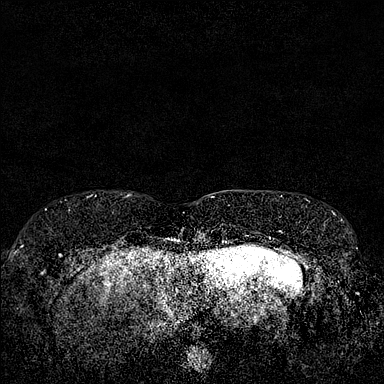
[im 72/144]
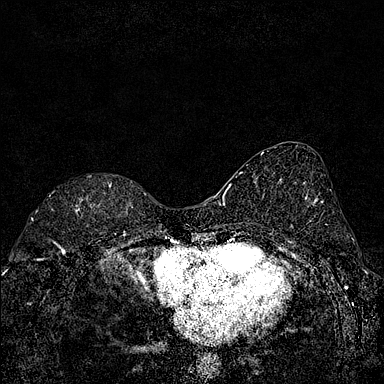
[im 108/144]
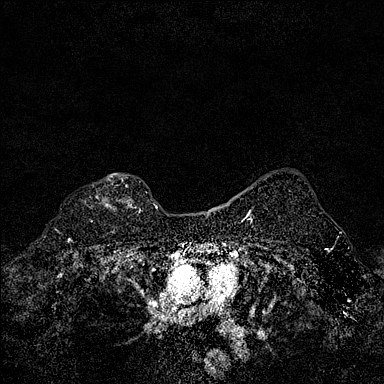
[im 144/144]
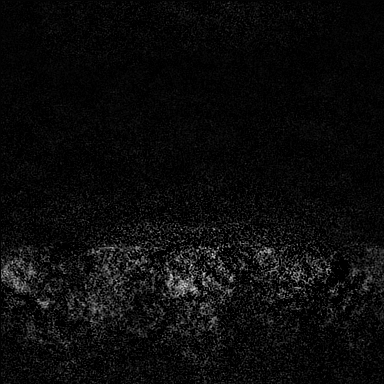

[Series 7: fl3d post-cm 20 · axial · 172.8mm · 0.94mm/px · 1 of 1 slices shown (3 of 3)]
[im 1/1]
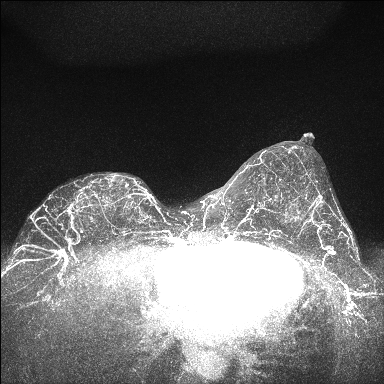

[Series 8: fl3d post-cm 3 · axial · 1.2mm · 0.94mm/px · z∈[-67,+105]mm · 5 of 144 slices shown (1 of 2)]
[im 1/144]
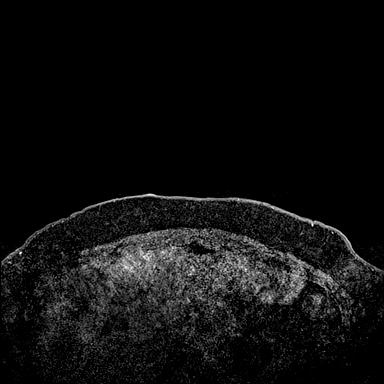
[im 36/144]
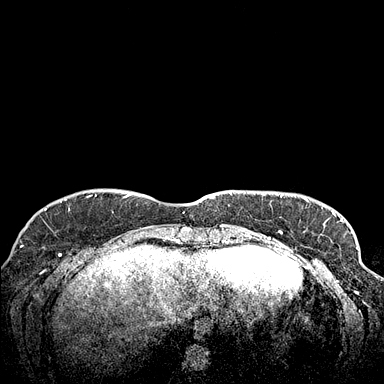
[im 72/144]
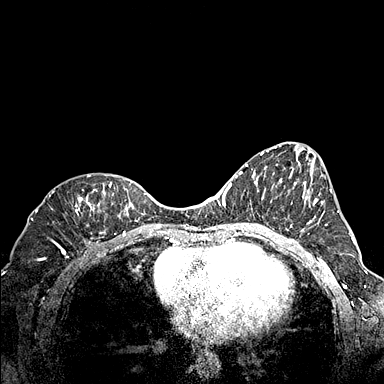
[im 108/144]
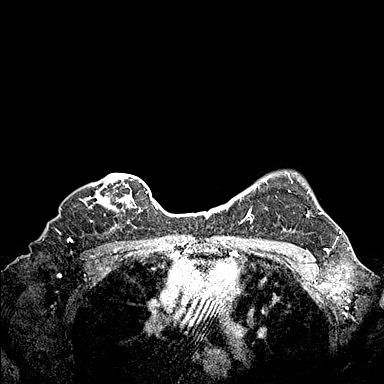
[im 144/144]
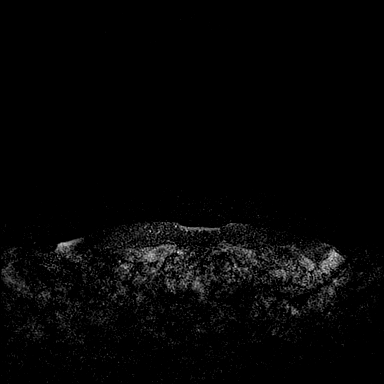

[Series 9: fl3d post-cm 3 · axial · 1.2mm · 0.94mm/px · z∈[-67,+70]mm · 5 of 144 slices shown (2 of 2)]
[im 1/144]
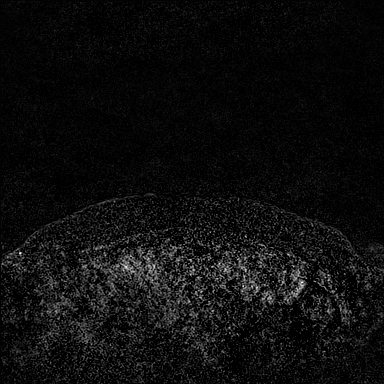
[im 29/144]
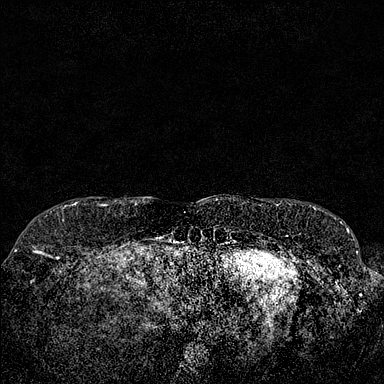
[im 58/144]
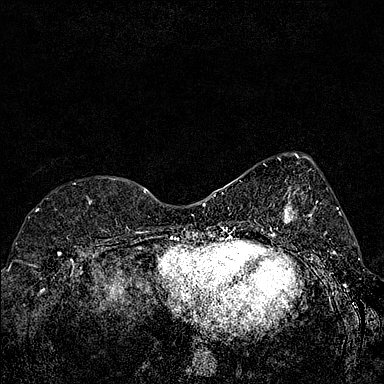
[im 86/144]
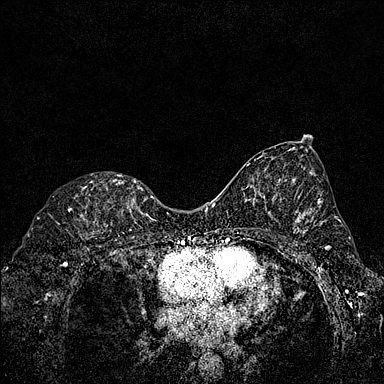
[im 115/144]
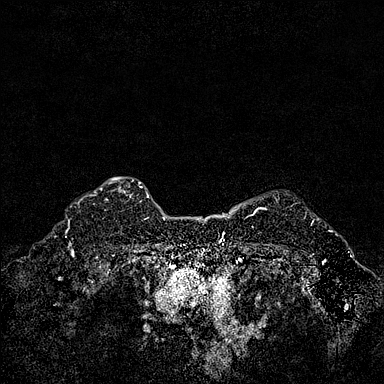

[32 of 48 positions shown; findings below may reference images not displayed]

Three-dimensional MR images were rendered by post-processing of the
original MR data on an independent workstation. The
three-dimensional MR images were interpreted, and findings are
reported in the following complete MRI report for this study. Three
dimensional images were evaluated at the independent interpreting
workstation using the DynaCAD thin client.
FINDINGS: Breast composition: b. Scattered fibroglandular tissue.

Background parenchymal enhancement: Minimal

Right breast: No mass or abnormal enhancement. Tissue marker clip
artifact is identified MEDIAL to the RIGHT nipple.

Left breast: Tissue marker clip artifact is identified in the
UPPER-OUTER QUADRANT of the LEFT breast. An area of non mass
enhancement is seen in the posterior LOWER OUTER the QUADRANT of the
LEFT breast and measures 2.2 x 1.4 x 0.8 centimeters. (Image 89 of
series 12). Internal enhancement is mildly heterogeneous and shows
persistent kinetics.

Lymph nodes: No abnormal appearing lymph nodes.

Ancillary findings:  None.
IMPRESSION: 1. Non mass enhancement in the LOWER OUTER QUADRANT of the LEFT
breast warranting tissue diagnosis. Sonographic correlate is felt to
be unlikely.
2. Normal appearance of the RIGHT breast.

RECOMMENDATION:
Recommend MR guided core biopsy of the LEFT breast.

BI-RADS CATEGORY  4: Suspicious.
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
## 2021-06-06 IMAGING — MR MR BREAST BX W LOC DEV 1ST LESION IMAGE BX SPEC MR GUIDE*L*
7 of 10 series · 33 of 48 positions shown · IV contrast (8 ml gadavist)
Comparison: Previous exams.
COMPARISON: Previous exams.

Addendum:
CLINICAL DATA: 45-year-old female presenting for biopsy of non mass
enhancement in the left breast.

EXAM:
MRI GUIDED CORE NEEDLE BIOPSY OF THE LEFT BREAST
TECHNIQUE: Multiplanar, multisequence MR imaging of the left breast was
performed both before and after administration of intravenous
contrast.
CONTRAST:  8mL GADAVIST GADOBUTROL 1 MMOL/ML IV SOLN

[Series 2: fiducial unilateral · sagittal · 2.0mm · 1.33mm/px · 3 of 51 slices shown]
[im 1/51]
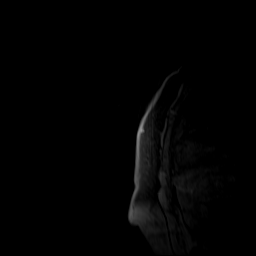
[im 26/51]
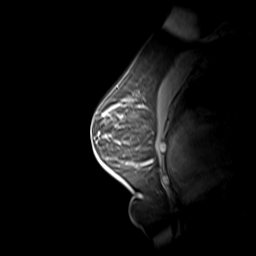
[im 51/51]
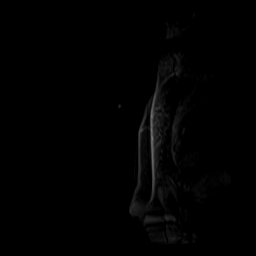

[Series 3: dynamic pre · axial · non-contrast · 1.3mm · 0.73mm/px · z∈[-89,+97]mm · 5 of 144 slices shown]
[im 1/144]
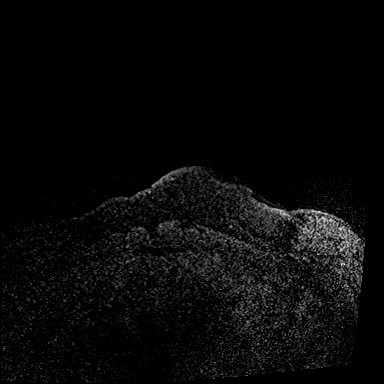
[im 36/144]
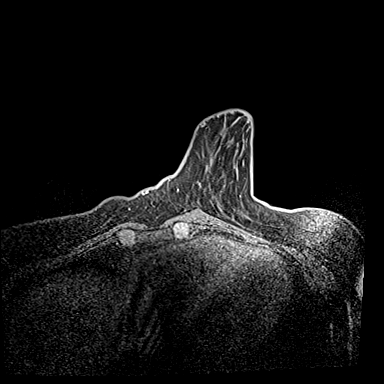
[im 72/144]
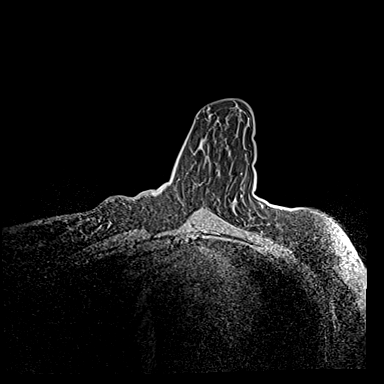
[im 108/144]
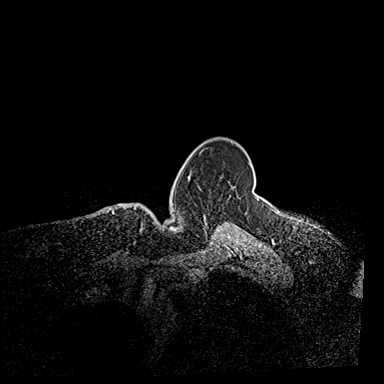
[im 144/144]
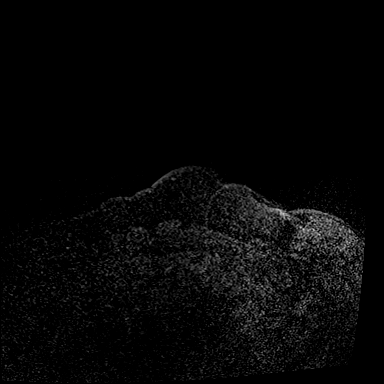

[Series 4: dynamic post 20 · axial · 1.3mm · 0.73mm/px · z∈[-89,+97]mm · 5 of 144 slices shown (1 of 2)]
[im 1/144]
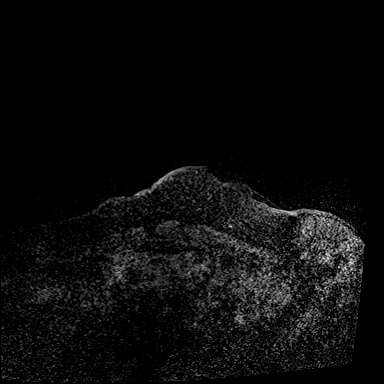
[im 36/144]
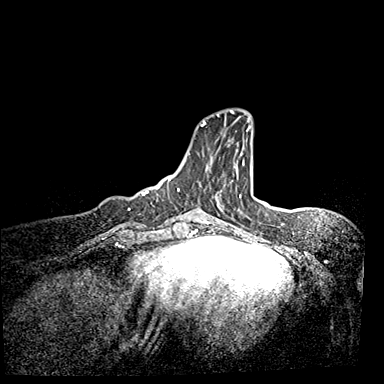
[im 72/144]
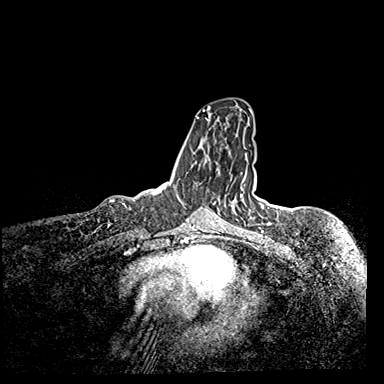
[im 108/144]
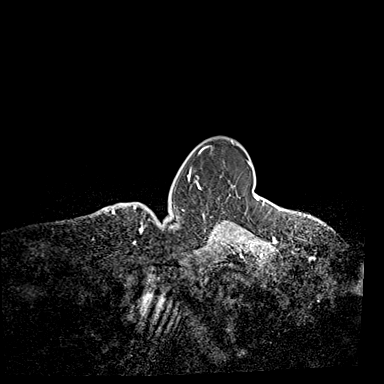
[im 144/144]
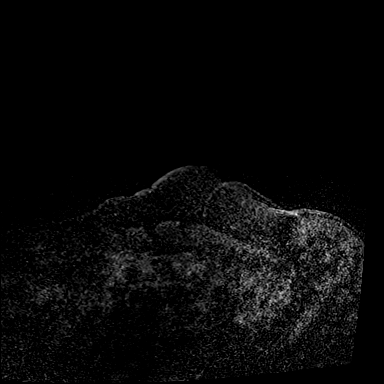

[Series 5: dynamic post 20 · axial · 1.3mm · 0.73mm/px · z∈[-89,+97]mm · 5 of 144 slices shown (2 of 2)]
[im 1/144]
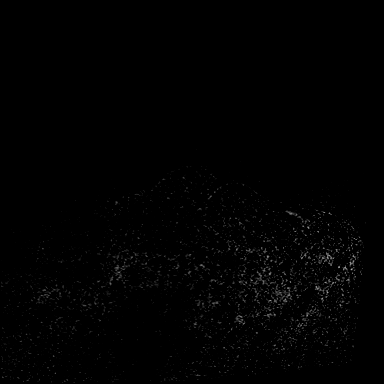
[im 36/144]
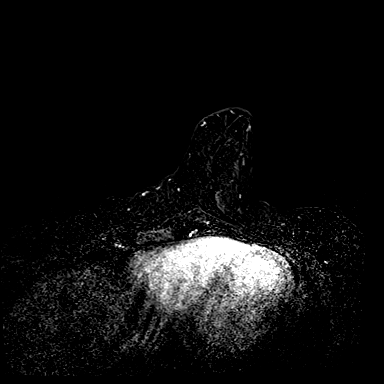
[im 72/144]
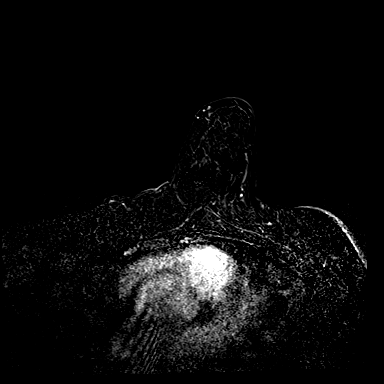
[im 108/144]
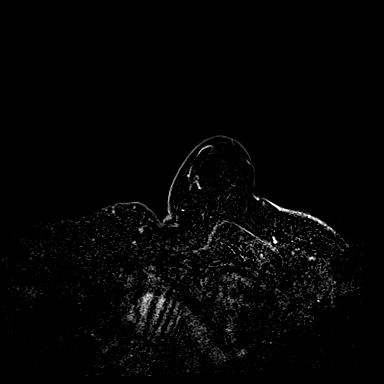
[im 144/144]
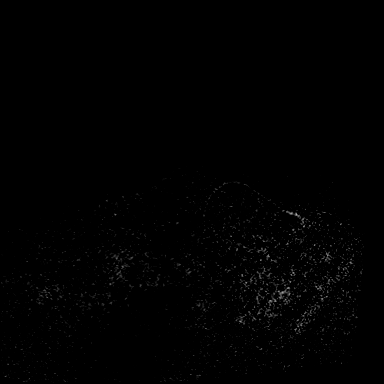

[Series 6: dynamic post 3 · axial · 1.3mm · 0.73mm/px · z∈[-89,+97]mm · 5 of 144 slices shown (1 of 2)]
[im 1/144]
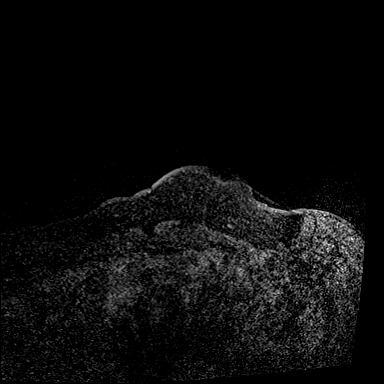
[im 36/144]
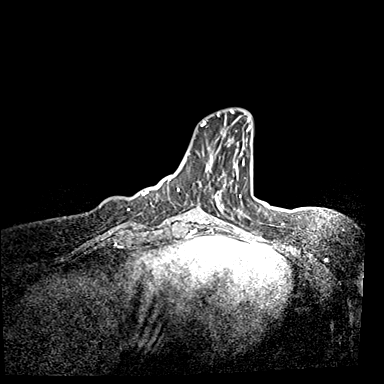
[im 72/144]
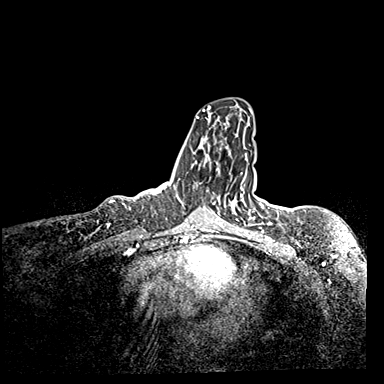
[im 108/144]
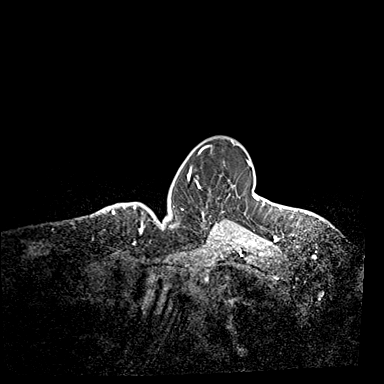
[im 144/144]
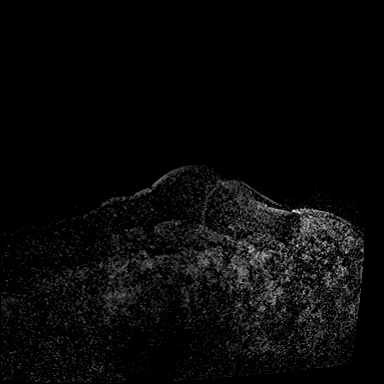

[Series 7: dynamic post 3 · axial · 1.3mm · 0.73mm/px · z∈[-89,+97]mm · 5 of 144 slices shown (2 of 2)]
[im 1/144]
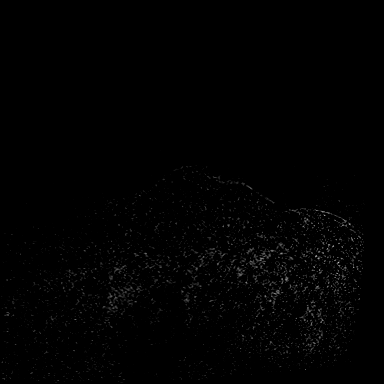
[im 36/144]
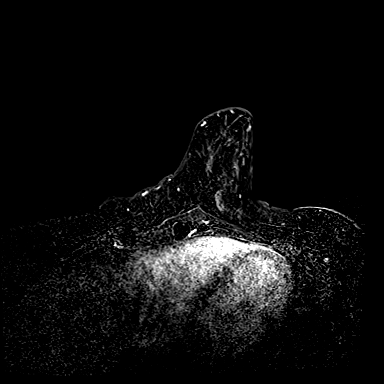
[im 72/144]
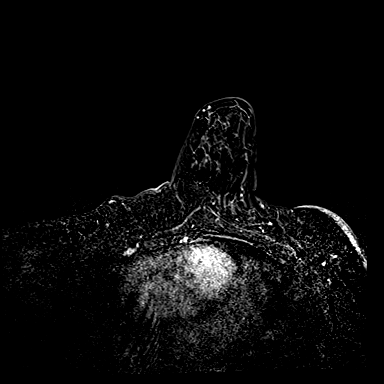
[im 108/144]
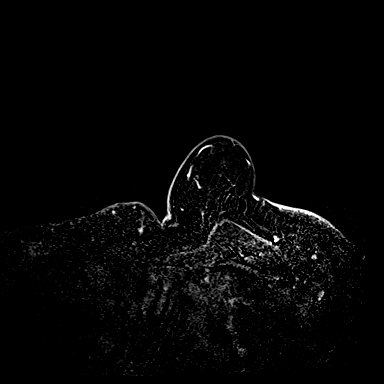
[im 144/144]
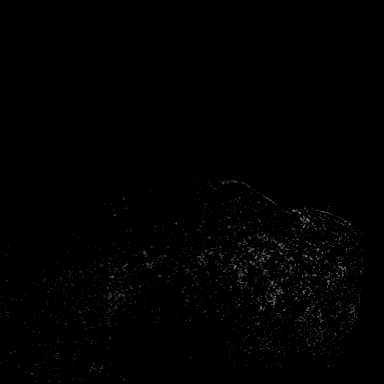

[Series 8: post bx · axial · 1.3mm · 0.73mm/px · z∈[-89,+97]mm · 5 of 144 slices shown]
[im 1/144]
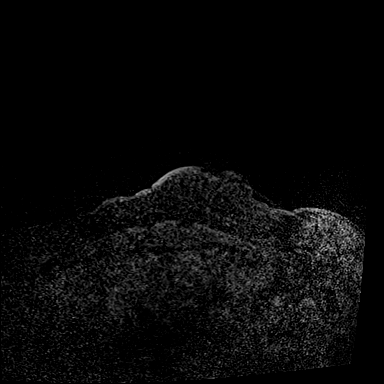
[im 36/144]
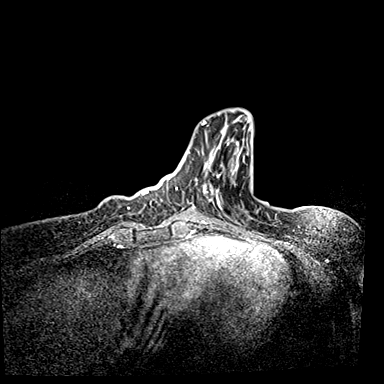
[im 72/144]
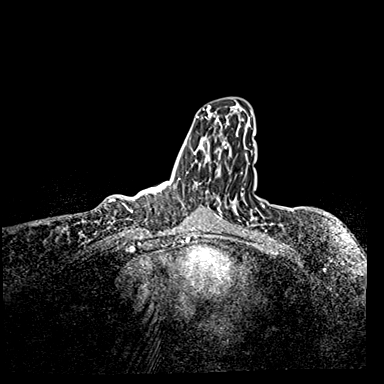
[im 108/144]
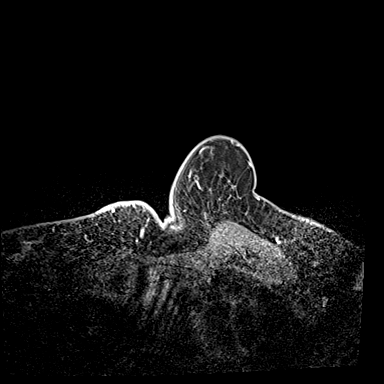
[im 144/144]
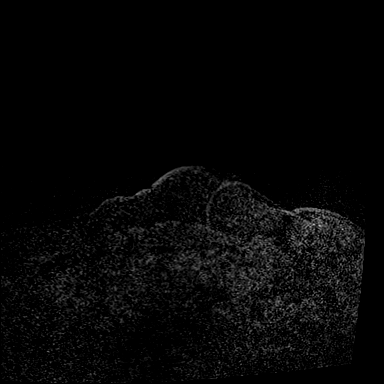

[33 of 48 positions shown; findings below may reference images not displayed]

FINDINGS: I met with the patient, and we discussed the procedure of MRI guided
biopsy, including risks, benefits, and alternatives. Specifically,
we discussed the risks of infection, bleeding, tissue injury, clip
migration, and inadequate sampling. Informed, written consent was
given. The usual time out protocol was performed immediately prior
to the procedure.

Using sterile technique, 1% Lidocaine, MRI guidance, and a 9 gauge
vacuum assisted device, biopsy was performed of non mass enhancement
in the lower outer left breast using a lateral approach. At the
conclusion of the procedure, a a cylinder tissue marker clip was
deployed into the biopsy cavity. Follow-up 2-view mammogram was
performed and dictated separately.
IMPRESSION: MRI guided biopsy of non mass enhancement in the lower outer left
breast. No apparent complications.

ADDENDUM:
Pathology revealed FIBROCYSTIC CHANGE WITH PSEUDOANGIOMATOUS STROMAL
HYPERPLASIA- NEGATIVE FOR CARCINOMA of the LEFT breast, lower outer,
cylinder clip. This was found to be concordant by Dr. GALI
GALI.

Pathology results were discussed with the patient by telephone. The
patient reported doing well after the biopsy with tenderness at the
site. Post biopsy instructions and care were reviewed and questions
were answered. The patient was encouraged to call The [REDACTED]

Bilateral breast MRI recommended in 6 months per protocol.

Pathology results reported by GALI RN on [DATE].

*** End of Addendum ***
FINDINGS: I met with the patient, and we discussed the procedure of MRI guided
biopsy, including risks, benefits, and alternatives. Specifically,
we discussed the risks of infection, bleeding, tissue injury, clip
migration, and inadequate sampling. Informed, written consent was
given. The usual time out protocol was performed immediately prior
to the procedure.

Using sterile technique, 1% Lidocaine, MRI guidance, and a 9 gauge
vacuum assisted device, biopsy was performed of non mass enhancement
in the lower outer left breast using a lateral approach. At the
conclusion of the procedure, a a cylinder tissue marker clip was
deployed into the biopsy cavity. Follow-up 2-view mammogram was
performed and dictated separately.
IMPRESSION: MRI guided biopsy of non mass enhancement in the lower outer left
breast. No apparent complications.

## 2021-06-06 IMAGING — MG MM BREAST LOCALIZATION CLIP
4 series · 4 of 12 positions shown · non-contrast
Comparison: Previous exam(s).

CLINICAL DATA: Post procedure mammogram for clip placement.

EXAM:
3D DIAGNOSTIC LEFT MAMMOGRAM POST MRI BIOPSY

[L CC synth-2D]
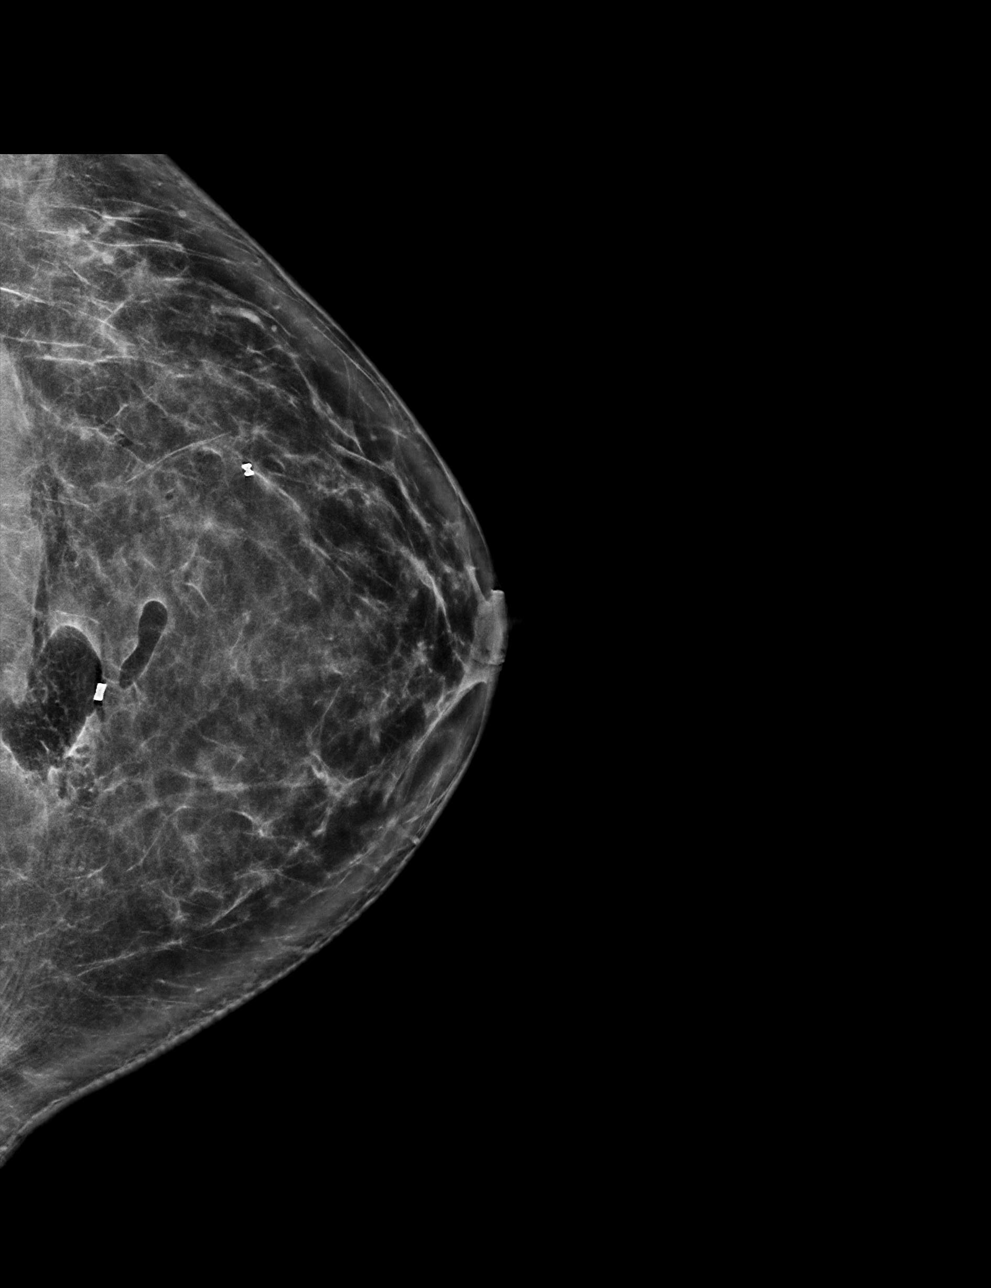

[L ML synth-2D]
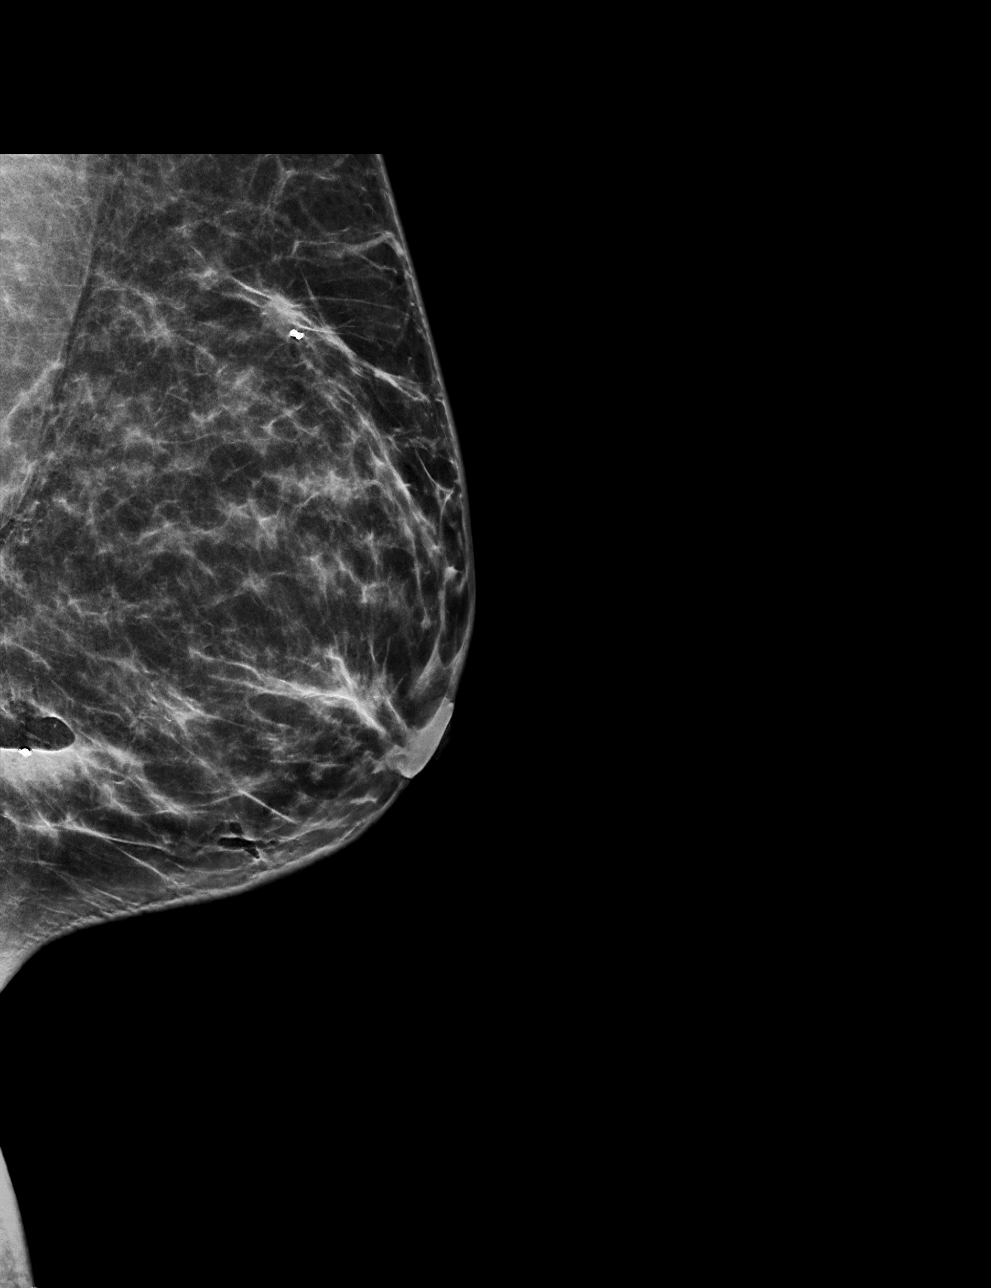

[L ML tomo · tomo slice 33/64.0]
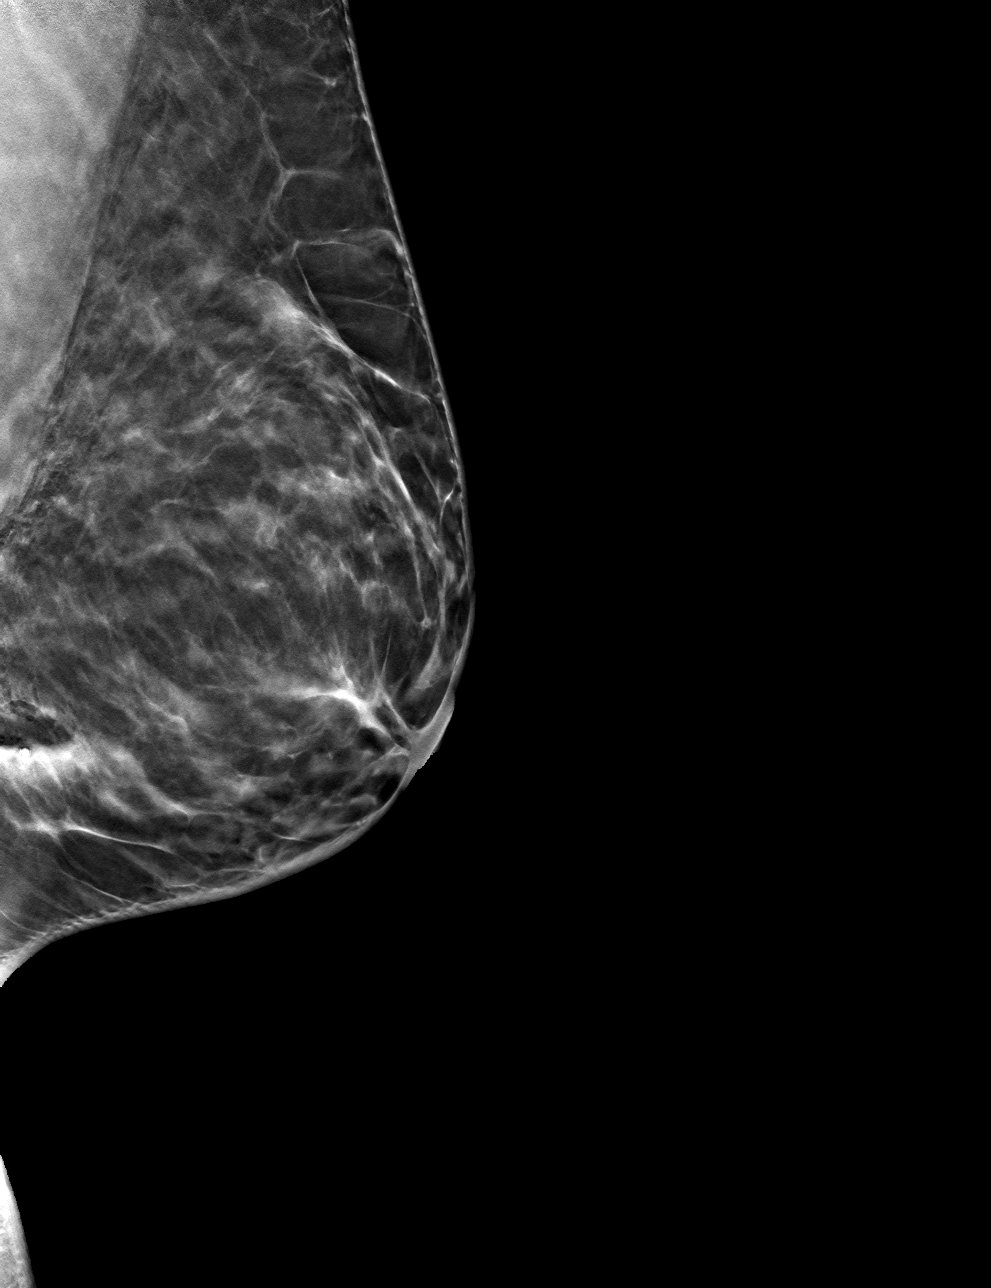

[L CC tomo · tomo slice 39/76.0]
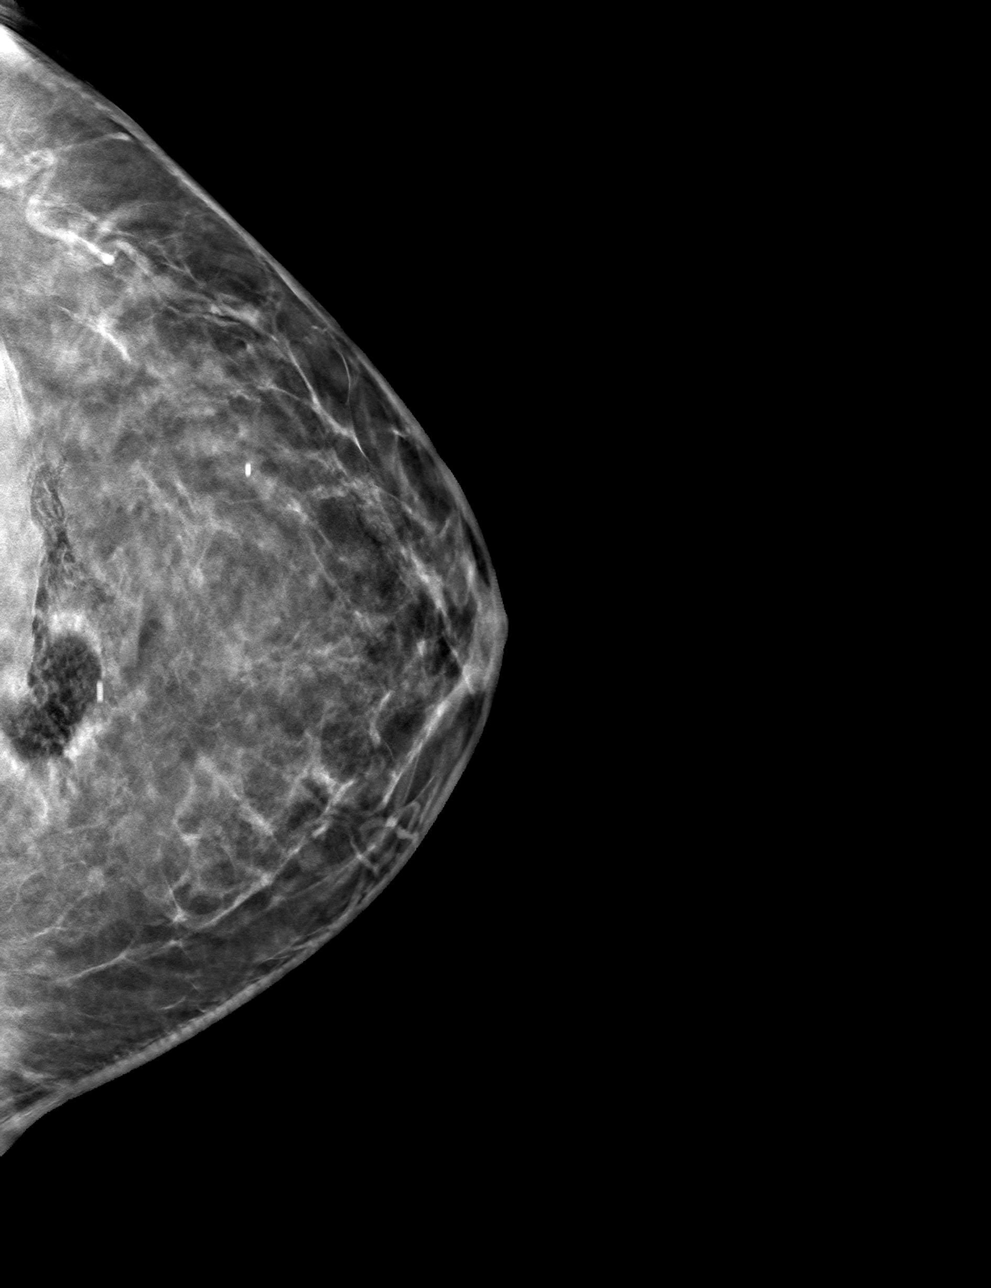

[4 of 12 positions shown; findings below may reference images not displayed]

FINDINGS: 3D Mammographic images were obtained following MRI guided biopsy of
non mass enhancement in the lower outer left breast. The cylinder
biopsy marking clip is in expected position at the site of biopsy.
IMPRESSION: Appropriate positioning of the cylinder shaped biopsy marking clip
at the site of biopsy in the lower outer left breast.

Final Assessment: Post Procedure Mammograms for Marker Placement
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
## 2021-12-06 IMAGING — DX DG CHEST 2V
2 series · 2 of 2 positions shown · non-contrast
Comparison: [DATE]

CLINICAL DATA: Lingering cough since [DATE]. History of
COVID.

EXAM:
CHEST - 2 VIEW

[chest pa]
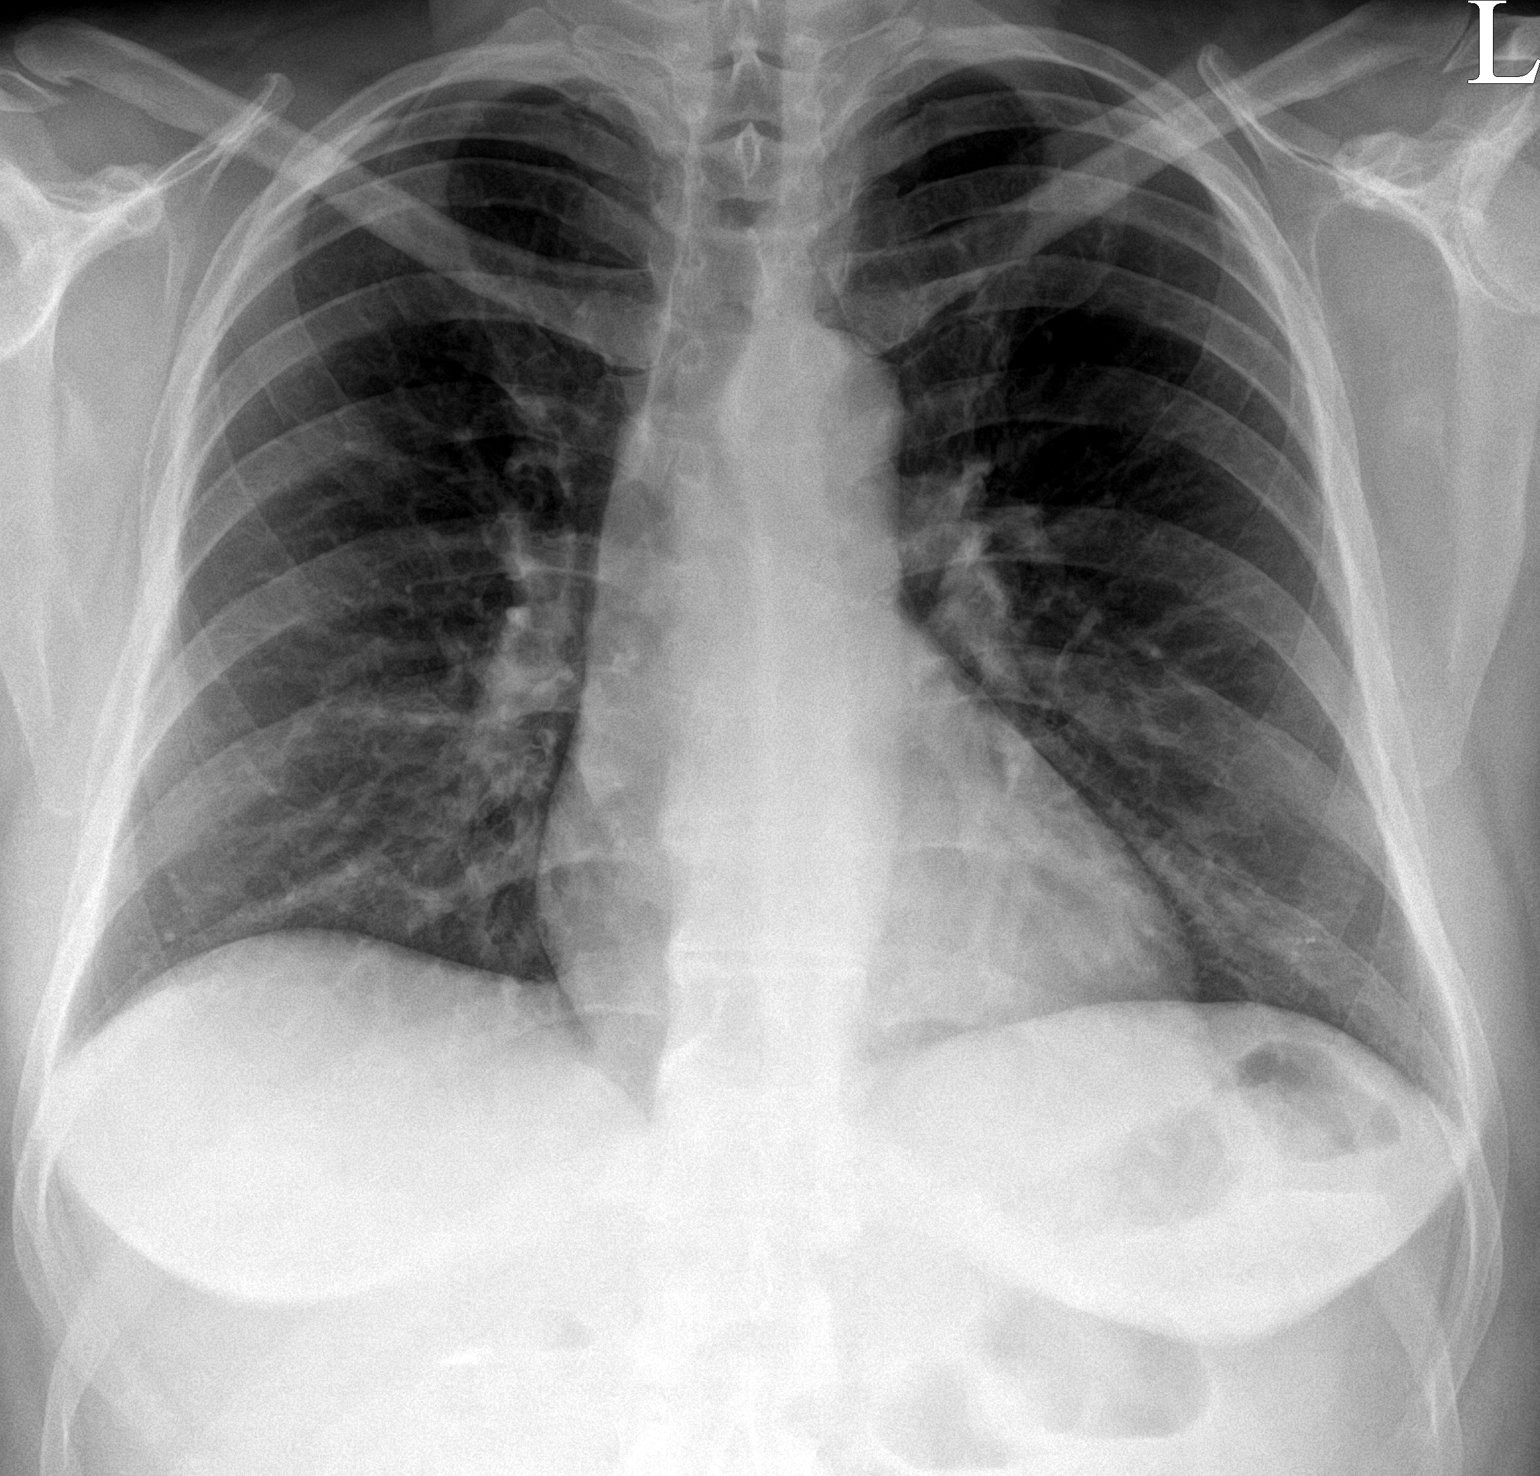

[chest lat]
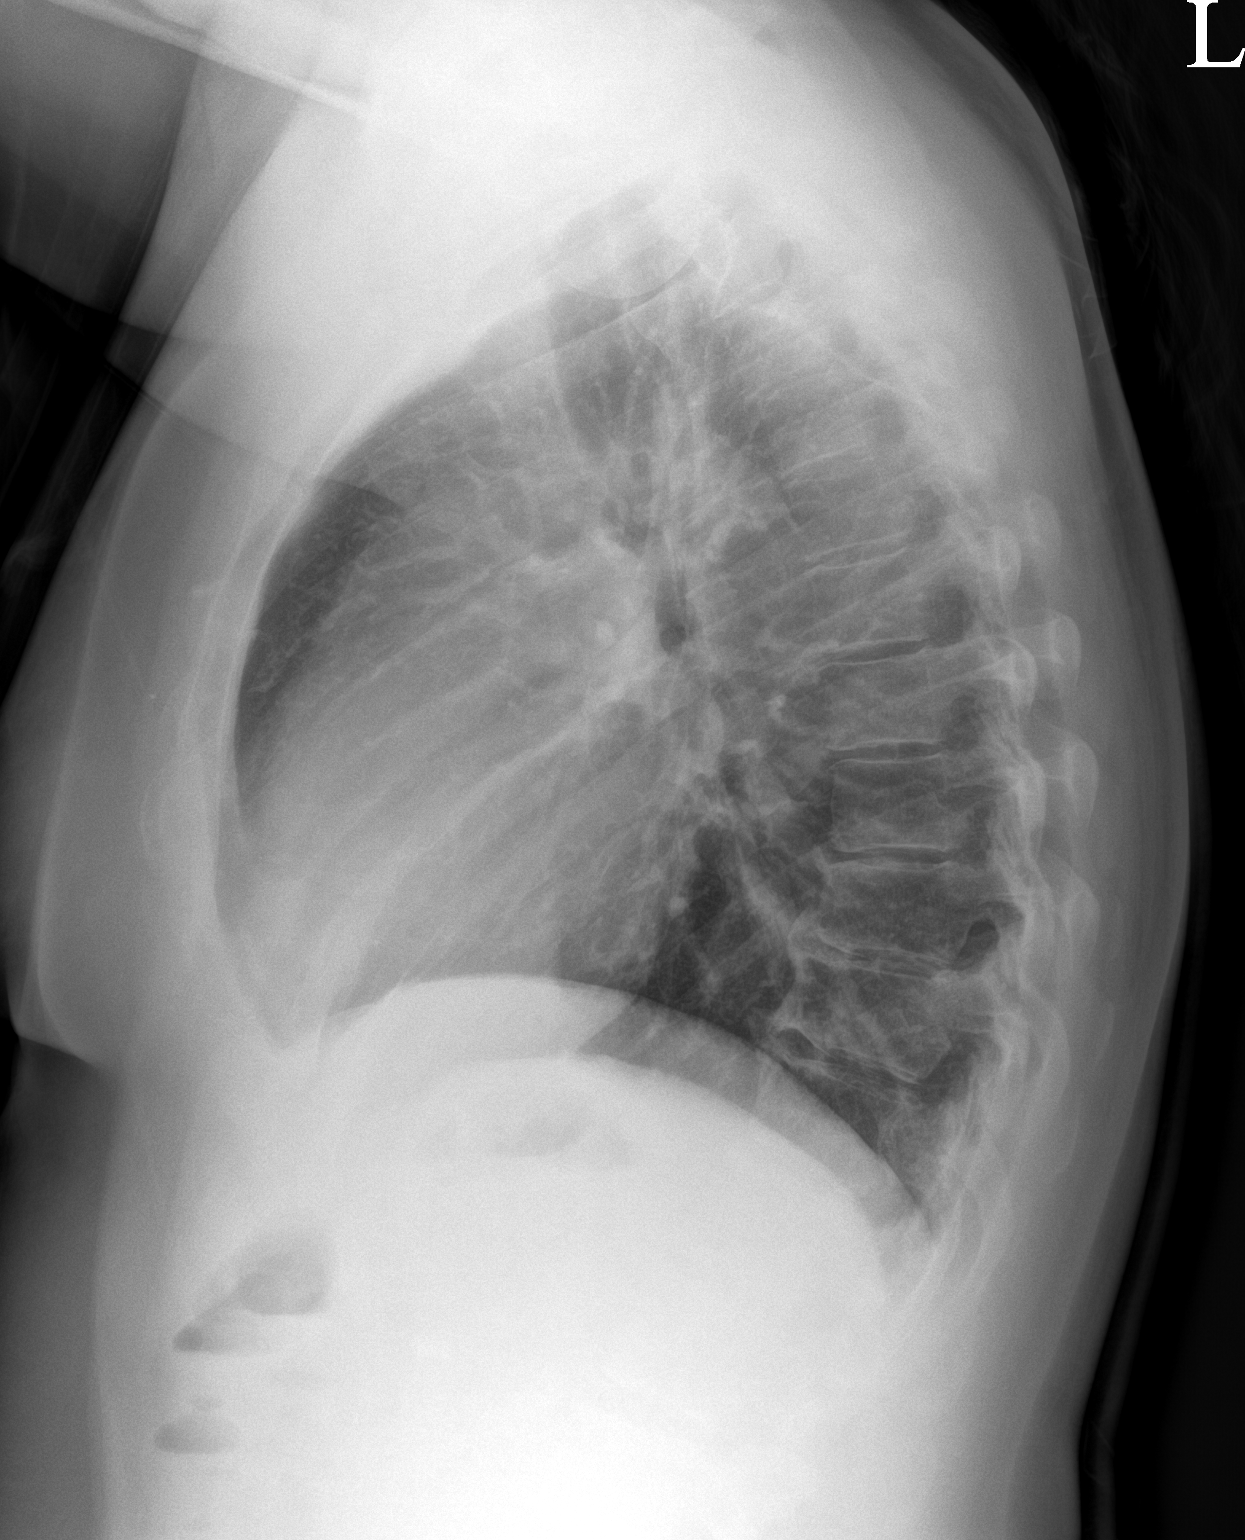

[2 of 2 positions shown; findings below may reference images not displayed]

FINDINGS: Cardiomediastinal silhouette and pulmonary vasculature are within
normal limits.

Lungs are clear.
IMPRESSION: No acute cardiopulmonary process.
